# Patient Record
Sex: Female | Born: 1950
Health system: Southern US, Community
[De-identification: ages and names within clinical notes are randomized; demographics above are authoritative.]

## PROBLEM LIST (undated history)

## (undated) DIAGNOSIS — R42 Dizziness and giddiness: Secondary | ICD-10-CM

## (undated) DIAGNOSIS — F411 Generalized anxiety disorder: Secondary | ICD-10-CM

## (undated) DIAGNOSIS — I10 Essential (primary) hypertension: Secondary | ICD-10-CM

## (undated) DIAGNOSIS — N6459 Other signs and symptoms in breast: Secondary | ICD-10-CM

## (undated) DIAGNOSIS — E785 Hyperlipidemia, unspecified: Secondary | ICD-10-CM

## (undated) HISTORY — DX: Hyperlipidemia, unspecified: E78.5

## (undated) HISTORY — PX: APPENDECTOMY: SHX54

## (undated) HISTORY — PX: ABDOMINAL HYSTERECTOMY: SHX81

## (undated) HISTORY — PX: MENISCUS REPAIR: SHX5179

## (undated) HISTORY — DX: Generalized anxiety disorder: F41.1

---

## 2015-02-04 DIAGNOSIS — Z1389 Encounter for screening for other disorder: Secondary | ICD-10-CM | POA: Diagnosis not present

## 2015-02-04 DIAGNOSIS — Z0000 Encounter for general adult medical examination without abnormal findings: Secondary | ICD-10-CM | POA: Diagnosis not present

## 2015-02-04 DIAGNOSIS — Z13220 Encounter for screening for lipoid disorders: Secondary | ICD-10-CM | POA: Diagnosis not present

## 2015-02-04 DIAGNOSIS — I10 Essential (primary) hypertension: Secondary | ICD-10-CM | POA: Diagnosis not present

## 2015-04-22 DIAGNOSIS — H8101 Meniere's disease, right ear: Secondary | ICD-10-CM | POA: Diagnosis not present

## 2015-04-22 DIAGNOSIS — K130 Diseases of lips: Secondary | ICD-10-CM | POA: Diagnosis not present

## 2015-04-22 DIAGNOSIS — J320 Chronic maxillary sinusitis: Secondary | ICD-10-CM | POA: Diagnosis not present

## 2015-05-01 ENCOUNTER — Emergency Department (HOSPITAL_COMMUNITY)
Admission: EM | Admit: 2015-05-01 | Discharge: 2015-05-01 | Disposition: A | Discharge status: Home / Self Care | Payer: Medicare Other | Attending: Emergency Medicine | Admitting: Emergency Medicine

## 2015-05-01 ENCOUNTER — Emergency Department (HOSPITAL_COMMUNITY): Payer: Medicare Other

## 2015-05-01 ENCOUNTER — Encounter (HOSPITAL_COMMUNITY): Payer: Self-pay | Admitting: Emergency Medicine

## 2015-05-01 DIAGNOSIS — R51 Headache: Secondary | ICD-10-CM | POA: Diagnosis not present

## 2015-05-01 DIAGNOSIS — R5383 Other fatigue: Secondary | ICD-10-CM | POA: Insufficient documentation

## 2015-05-01 DIAGNOSIS — R002 Palpitations: Secondary | ICD-10-CM | POA: Diagnosis not present

## 2015-05-01 DIAGNOSIS — I10 Essential (primary) hypertension: Secondary | ICD-10-CM | POA: Diagnosis not present

## 2015-05-01 DIAGNOSIS — R109 Unspecified abdominal pain: Secondary | ICD-10-CM | POA: Insufficient documentation

## 2015-05-01 DIAGNOSIS — R000 Tachycardia, unspecified: Secondary | ICD-10-CM | POA: Diagnosis not present

## 2015-05-01 DIAGNOSIS — R42 Dizziness and giddiness: Secondary | ICD-10-CM | POA: Insufficient documentation

## 2015-05-01 DIAGNOSIS — R112 Nausea with vomiting, unspecified: Secondary | ICD-10-CM | POA: Insufficient documentation

## 2015-05-01 DIAGNOSIS — R079 Chest pain, unspecified: Secondary | ICD-10-CM | POA: Diagnosis not present

## 2015-05-01 DIAGNOSIS — R519 Headache, unspecified: Secondary | ICD-10-CM

## 2015-05-01 DIAGNOSIS — R251 Tremor, unspecified: Secondary | ICD-10-CM

## 2015-05-01 HISTORY — DX: Dizziness and giddiness: R42

## 2015-05-01 HISTORY — DX: Essential (primary) hypertension: I10

## 2015-05-01 LAB — URINALYSIS, ROUTINE W REFLEX MICROSCOPIC
Bilirubin Urine: NEGATIVE
Glucose, UA: NEGATIVE mg/dL
Hgb urine dipstick: NEGATIVE
Ketones, ur: NEGATIVE mg/dL
Leukocytes, UA: NEGATIVE
Nitrite: NEGATIVE
Protein, ur: NEGATIVE mg/dL
Specific Gravity, Urine: 1.018 (ref 1.005–1.030)
Urobilinogen, UA: 0.2 mg/dL (ref 0.0–1.0)
pH: 6.5 (ref 5.0–8.0)

## 2015-05-01 LAB — I-STAT TROPONIN, ED: TROPONIN I, POC: 0 ng/mL (ref 0.00–0.08)

## 2015-05-01 LAB — COMPREHENSIVE METABOLIC PANEL
ALT: 19 U/L (ref 14–54)
AST: 30 U/L (ref 15–41)
Albumin: 4.7 g/dL (ref 3.5–5.0)
Alkaline Phosphatase: 69 U/L (ref 38–126)
Anion gap: 11 (ref 5–15)
BUN: 15 mg/dL (ref 6–20)
CO2: 30 mmol/L (ref 22–32)
Calcium: 10.1 mg/dL (ref 8.9–10.3)
Chloride: 98 mmol/L — ABNORMAL LOW (ref 101–111)
Creatinine, Ser: 0.68 mg/dL (ref 0.44–1.00)
GFR calc Af Amer: >60 mL/min (ref >60)
GFR calc non Af Amer: >60 mL/min (ref >60)
Glucose, Bld: 117 mg/dL — ABNORMAL HIGH (ref 65–99)
Potassium: 3.3 mmol/L — ABNORMAL LOW (ref 3.5–5.1)
Sodium: 139 mmol/L (ref 135–145)
Total Bilirubin: 0.6 mg/dL (ref 0.3–1.2)
Total Protein: 8.5 g/dL — ABNORMAL HIGH (ref 6.5–8.1)

## 2015-05-01 LAB — CBC
HCT: 41.7 % (ref 36.0–46.0)
Hemoglobin: 14.4 g/dL (ref 12.0–15.0)
MCH: 31.6 pg (ref 26.0–34.0)
MCHC: 34.5 g/dL (ref 30.0–36.0)
MCV: 91.6 fL (ref 78.0–100.0)
Platelets: 401 10*3/uL — ABNORMAL HIGH (ref 150–400)
RBC: 4.55 MIL/uL (ref 3.87–5.11)
RDW: 13.1 % (ref 11.5–15.5)
WBC: 7.8 10*3/uL (ref 4.0–10.5)

## 2015-05-01 LAB — CBG MONITORING, ED
GLUCOSE-CAPILLARY: 100 mg/dL — AB (ref 65–99)
Glucose-Capillary: 110 mg/dL — ABNORMAL HIGH (ref 65–99)

## 2015-05-01 LAB — LIPASE, BLOOD: Lipase: 33 U/L (ref 22–51)

## 2015-05-01 MED ORDER — ONDANSETRON 4 MG PO TBDP: 4.0000 mg | ORAL_TABLET | Freq: Three times a day (TID) | ORAL | Status: DC | PRN

## 2015-05-01 MED ORDER — MORPHINE SULFATE (PF) 4 MG/ML IV SOLN
4.0000 mg | Freq: Once | INTRAVENOUS | Status: AC
  Administered 2015-05-01: 4 mg via INTRAVENOUS
  Filled 2015-05-01: qty 1

## 2015-05-01 MED ORDER — ACETAMINOPHEN 325 MG PO TABS
650.0000 mg | ORAL_TABLET | Freq: Once | ORAL | Status: AC
  Administered 2015-05-01: 650 mg via ORAL
  Filled 2015-05-01: qty 2

## 2015-05-01 MED ORDER — LORAZEPAM 1 MG PO TABS: 0.5000 mg | ORAL_TABLET | ORAL | Status: DC | PRN

## 2015-05-01 MED ORDER — ONDANSETRON HCL 4 MG/2ML IJ SOLN
4.0000 mg | Freq: Once | INTRAMUSCULAR | Status: AC
  Administered 2015-05-01: 4 mg via INTRAVENOUS
  Filled 2015-05-01: qty 2

## 2015-05-01 MED ORDER — POTASSIUM CHLORIDE CRYS ER 20 MEQ PO TBCR
40.0000 meq | EXTENDED_RELEASE_TABLET | Freq: Once | ORAL | Status: AC
  Administered 2015-05-01: 40 meq via ORAL
  Filled 2015-05-01: qty 2

## 2015-05-01 MED ORDER — SODIUM CHLORIDE 0.9 % IV BOLUS (SEPSIS)
1000.0000 mL | Freq: Once | INTRAVENOUS | Status: AC
Start: 2015-05-01 — End: 2015-05-01
  Administered 2015-05-01: 1000 mL via INTRAVENOUS

## 2015-05-01 MED ORDER — LORAZEPAM 2 MG/ML IJ SOLN
0.5000 mg | Freq: Once | INTRAMUSCULAR | Status: AC
  Administered 2015-05-01: 0.5 mg via INTRAVENOUS
  Filled 2015-05-01: qty 1

## 2015-05-01 MED ORDER — HYDROCODONE-ACETAMINOPHEN 5-325 MG PO TABS: 1.0000 | ORAL_TABLET | ORAL | Status: DC | PRN

## 2015-05-01 NOTE — Discharge Instructions (Signed)
1. Medications: vicodin, zofran, ativan usual home medications 2. Treatment: rest, drink plenty of fluids 3. Follow Up: please followup with your primary doctor within the next week for discussion of your diagnoses and further evaluation after today's visit; if you do not have a primary care doctor use the resource guide provided to find one; please return to the ER for persistent symptoms, high fever, worsening headache, intractable vomiting, falls, loss of consciousness   General Headache Without Cause A general headache is pain or discomfort felt around the head or neck area. The cause may not be found.  HOME CARE   Keep all doctor visits.  Only take medicines as told by your doctor.  Lie down in a dark, quiet room when you have a headache.  Keep a journal to find out if certain things bring on headaches. For example, write down:  What you eat and drink.  How much sleep you get.  Any change to your diet or medicines.  Relax by getting a massage or doing other relaxing activities.  Put ice or heat packs on the head and neck area as told by your doctor.  Lessen stress.  Sit up straight. Do not tighten (tense) your muscles.  Quit smoking if you smoke.  Lessen how much alcohol you drink.  Lessen how much caffeine you drink, or stop drinking caffeine.  Eat and sleep on a regular schedule.  Get 7 to 9 hours of sleep, or as told by your doctor.  Keep lights dim if bright lights bother you or make your headaches worse. GET HELP RIGHT AWAY IF:   Your headache becomes really bad.  You have a fever.  You have a stiff neck.  You have trouble seeing.  Your muscles are weak, or you lose muscle control.  You lose your balance or have trouble walking.  You feel like you will pass out (faint), or you pass out.  You have really bad symptoms that are different than your first symptoms.  You have problems with the medicines given to you by your doctor.  Your medicines do  not work.  Your headache feels different than the other headaches.  You feel sick to your stomach (nauseous) or throw up (vomit). MAKE SURE YOU:   Understand these instructions.  Will watch your condition.  Will get help right away if you are not doing well or get worse. Document Released: 04/27/2008 Document Revised: 10/11/2011 Document Reviewed: 07/09/2011 [facility] Patient Information 2015 [place], [place]. This information is not intended to replace advice given to you by your health care provider. Make sure you discuss any questions you have with your health care provider.  Nausea and Vomiting Nausea is a sick feeling that often comes before throwing up (vomiting). Vomiting is a reflex where stomach contents come out of your mouth. Vomiting can cause severe loss of body fluids (dehydration). Children and elderly adults can become dehydrated quickly, especially if they also have diarrhea. Nausea and vomiting are symptoms of a condition or disease. It is important to find the cause of your symptoms. CAUSES   Direct irritation of the stomach lining. This irritation can result from increased acid production (gastroesophageal reflux disease), infection, food poisoning, taking certain medicines (such as nonsteroidal anti-inflammatory drugs), alcohol use, or tobacco use.  Signals from the brain.These signals could be caused by a headache, heat exposure, an inner ear disturbance, increased pressure in the brain from injury, infection, a tumor, or a concussion, pain, emotional stimulus, or metabolic problems.  An obstruction in the gastrointestinal tract (bowel obstruction).  Illnesses such as diabetes, hepatitis, gallbladder problems, appendicitis, kidney problems, cancer, sepsis, atypical symptoms of a heart attack, or eating disorders.  Medical treatments such as chemotherapy and radiation.  Receiving medicine that makes you sleep (general anesthetic) during surgery. DIAGNOSIS Your  caregiver may ask for tests to be done if the problems do not improve after a few days. Tests may also be done if symptoms are severe or if the reason for the nausea and vomiting is not clear. Tests may include:  Urine tests.  Blood tests.  Stool tests.  Cultures (to look for evidence of infection).  X-rays or other imaging studies. Test results can help your caregiver make decisions about treatment or the need for additional tests. TREATMENT You need to stay well hydrated. Drink frequently but in small amounts.You may wish to drink water, sports drinks, clear broth, or eat frozen ice pops or gelatin dessert to help stay hydrated.When you eat, eating slowly may help prevent nausea.There are also some antinausea medicines that may help prevent nausea. HOME CARE INSTRUCTIONS   Take all medicine as directed by your caregiver.  If you do not have an appetite, do not force yourself to eat. However, you must continue to drink fluids.  If you have an appetite, eat a normal diet unless your caregiver tells you differently.  Eat a variety of complex carbohydrates (rice, wheat, potatoes, bread), lean meats, yogurt, fruits, and vegetables.  Avoid high-fat foods because they are more difficult to digest.  Drink enough water and fluids to keep your urine clear or pale yellow.  If you are dehydrated, ask your caregiver for specific rehydration instructions. Signs of dehydration may include:  Severe thirst.  Dry lips and mouth.  Dizziness.  Dark urine.  Decreasing urine frequency and amount.  Confusion.  Rapid breathing or pulse. SEEK IMMEDIATE MEDICAL CARE IF:   You have blood or brown flecks (like coffee grounds) in your vomit.  You have black or bloody stools.  You have a severe headache or stiff neck.  You are confused.  You have severe abdominal pain.  You have chest pain or trouble breathing.  You do not urinate at least once every 8 hours.  You develop cold or  clammy skin.  You continue to vomit for longer than 24 to 48 hours.  You have a fever. MAKE SURE YOU:   Understand these instructions.  Will watch your condition.  Will get help right away if you are not doing well or get worse. Document Released: 07/19/2005 Document Revised: 10/11/2011 Document Reviewed: 12/16/2010 [facility] Patient Information 2015 [place], [place]. This information is not intended to replace advice given to you by your health care provider. Make sure you discuss any questions you have with your health care provider.  Tremor Tremor is a rhythmic, involuntary muscular contraction characterized by oscillations (to-and-fro movements) of a part of the body. The most common of all involuntary movements, tremor can affect various body parts such as the hands, head, facial structures, vocal cords, trunk, and legs; most tremors, however, occur in the hands. Tremor often accompanies neurological disorders associated with aging. Although the disorder is not life-threatening, it can be responsible for functional disability and social embarrassment. TREATMENT  There are many types of tremor and several ways in which tremor is classified. The most common classification is by behavioral context or position. There are five categories of tremor within this classification: resting, postural, kinetic, task-specific, and psychogenic. Resting or  static tremor occurs when the muscle is at rest, for example when the hands are lying on the lap. This type of tremor is often seen in patients with Parkinson's disease. Postural tremor occurs when a patient attempts to maintain posture, such as holding the hands outstretched. Postural tremors include physiological tremor, essential tremor, tremor with basal ganglia disease (also seen in patients with Parkinson's disease), cerebellar postural tremor, tremor with peripheral neuropathy, post-traumatic tremor, and alcoholic tremor. Kinetic or intention (action)  tremor occurs during purposeful movement, for example during finger-to-nose testing. Task-specific tremor appears when performing goal-oriented tasks such as handwriting, speaking, or standing. This group consists of primary writing tremor, vocal tremor, and orthostatic tremor. Psychogenic tremor occurs in both older and younger patients. The key feature of this tremor is that it dramatically lessens or disappears when the patient is distracted. PROGNOSIS There are some treatment options available for tremor; the appropriate treatment depends on accurate diagnosis of the cause. Some tremors respond to treatment of the underlying condition, for example in some cases of psychogenic tremor treating the patient's underlying mental problem may cause the tremor to disappear. Also, patients with tremor due to Parkinson's disease may be treated with Levodopa drug therapy. Symptomatic drug therapy is available for several other tremors as well. For those cases of tremor in which there is no effective drug treatment, physical measures such as teaching the patient to brace the affected limb during the tremor are sometimes useful. Surgical intervention such as thalamotomy or deep brain stimulation may be useful in certain cases. Document Released: 07/09/2002 Document Revised: 10/11/2011 Document Reviewed: 07/19/2005 [facility] Patient Information 2015 [place], [place]. This information is not intended to replace advice given to you by your health care provider. Make sure you discuss any questions you have with your health care provider.   Emergency Department Resource Guide 1) Find a Doctor and Pay Out of Pocket Although you won't have to find out who is covered by your insurance plan, it is a good idea to ask around and get recommendations. You will then need to call the office and see if the doctor you have chosen will accept you as a new patient and what types of options they offer for patients who are self-pay. Some  doctors offer discounts or will set up payment plans for their patients who do not have insurance, but you will need to ask so you aren't surprised when you get to your appointment.  2) Contact Your Local Health Department Not all health departments have doctors that can see patients for sick visits, but many do, so it is worth a call to see if yours does. If you don't know where your local health department is, you can check in your phone book. The CDC also has a tool to help you locate your state's health department, and many state websites also have listings of all of their local health departments.  3) Find a Walk-in Clinic If your illness is not likely to be very severe or complicated, you may want to try a walk in clinic. These are popping up all over the country in pharmacies, drugstores, and shopping centers. They're usually staffed by nurse practitioners or physician assistants that have been trained to treat common illnesses and complaints. They're usually fairly quick and inexpensive. However, if you have serious medical issues or chronic medical problems, these are probably not your best option.  No Primary Care Doctor: - Call Health Connect at  [phone] - they can help you locate  a primary care doctor that  accepts your insurance, provides certain services, etc. - Physician Referral Service- [phone]  Chronic Pain Problems: Organization         Address  Phone   Notes  [facility]  [phone] Patients need to be referred by their primary care doctor.   Medication Assistance: Organization         Address  Phone   Notes  [facility] Medication [facility] [address] [place] [place], [place] [address] [phone] --Must be a resident of [facility] -- Must have NO insurance coverage whatsoever (no Medicaid/ Medicare, etc.) -- The pt. MUST have a primary care doctor that directs their care regularly and follows them in the  community   [facility]  [phone]   [employer]  [phone]    Agencies that provide inexpensive medical care: Organization         Address  Phone   Notes  [facility]  [phone]   [facility]    [phone]   [facility] [address] [place], [place] [address] [phone]   [facility] [address] [place] [place] [phone]   Planned Parenthood    [phone]   [facility]    [phone]   Community Health and [facility]  [address], [place] Phone:  [phone], Fax:  [phone] Hours of Operation:  9 am - 6 pm, M-F.  Also accepts Medicaid/Medicare and self-pay.  [facility]  [address], [place] Phone: [phone], Fax: [phone]. Hours of Operation:  8:30 am - 5:30 pm, M-F.  Also accepts Medicaid and self-pay.  [facility] [place] [address] [place] Phone: [phone]   [facility] [address] Natasha Bence [place], [place] [phone], Ext. [phone] Mondays & Thursdays: 7-9 AM.  First 15 patients are seen on a first come, first serve basis.    Medicaid-accepting [facility] Providers:  Organization         Address  Phone   Notes  [facility] [address], [place] [phone] Also accepts self-pay patients.  [facility] [address] Laurell Josephs [place], [place]  [phone]   [facility] [address], [place] [phone]   [facility] Family Medicine [address] [place] [phone]   Renaye Rakers [address], [place]   [phone] Only accepts [place] Access [place] patients after they have their name applied to their card.   Self-Pay (no insurance) in [facility]:  Organization         Address  Phone   Notes  Sickle Cell Patients, [facility]  Internal Medicine [address] [place], [place] [phone]   [facility] [address] [place], [place] [phone]   [facility]  [address] [place] [address], [place] [phone]   [facility]/Dr. Osei-Bonsu  [address], [place] or [address], [place] [phone] Phone number for both [place] and [place] locations is the same.  Urgent Medical and [facility] [address], [place] [phone]   [facility] [address] [place] or [address] [phone] (  [phone]   [facility] [address], [place] [phone], phone; [phone], fax Sees patients 1st and 3rd Saturday of every month.  Must not qualify for public or private insurance (i.e. Medicaid, Medicare, [place] Health Choice, Veterans' Benefits)  Household income should be no more than 200% of the poverty level The clinic cannot treat you if you are pregnant or think you are pregnant  Sexually transmitted diseases are not treated at the clinic.    Dental Care: Organization         Address  Phone  Notes  [facility] Department of [facility] [address] [place], [place] [phone] Accepts children up to age 21 who are enrolled in [place] or [place] Health Choice; pregnant women with a Medicaid card; and children who have applied for Medicaid or [place] Health Choice, but were declined, whose parents can pay a reduced fee at time of service.  [facility] Department of [facility]  [address], [place] [phone] Accepts children up to age 67 who are enrolled in [place] or [place] Health Choice; pregnant women with a Medicaid card; and children who have applied for Medicaid or [place] Health Choice, but were declined, whose parents can pay a reduced fee at time of service.  [facility]  [address] [place], [place] [phone] Patients are seen by appointment only. Walk-ins are not accepted. [facility] will see patients 72 years of age and older. Monday - Tuesday (8am-5pm) Most Wednesdays (8:30-5pm) $30 per visit, cash only  [facility] Adult Dental Access PRO[facility]  [address], [facility] [phone] Patients are seen by appointment only. Walk-ins are not accepted. [facility] will see patients 38 years of age and older. One Wednesday Evening (Monthly: Volunteer Based).  $30 per visit, cash only  [employer]  [phone] for adults; Children under age 46, call Graduate Pediatric Dentistry at [phone]. Children aged 58-14, please call [phone] to request a pediatric application.  Dental services are provided in all areas of dental care including fillings, crowns and bridges, complete and partial dentures, implants, gum treatment, root canals, and extractions. Preventive care is also provided. Treatment is provided to both adults and children. Patients are selected via a lottery and there is often a waiting list.   [facility] [address] [place]  [phone] [email or web address]   [facility] [address] [place], [place] [phone], Ext. [phone] Second and Fourth Thursday of each month, opens at 6:30 AM; Clinic ends at 9 AM.  Patients are seen on a first-come first-served basis, and a limited number are seen during each clinic.   [facility]  [address] Ether Griffins [place], [place] [phone]   Eligibility Requirements You must have lived in [place], [place], or [place] counties for at least the last three months.   You cannot be eligible for state or federal sponsored [place], including [employer], [place], or [employer].   You generally cannot be eligible for healthcare insurance through your employer.    How to apply: Eligibility screenings are held every  Tuesday and Wednesday afternoon from 1:00 pm until 4:00 pm. You do not need an appointment for the interview!  [facility] [address], [place], [place] [phone]   [facility]  [phone]   [facility] Health Department  [phone]  [facility] Department  [phone]    Behavioral Health Resources in the Community: Intensive Outpatient Programs Organization         Address  Phone  Notes  [facility] Services [address] [place], [place] [phone]   [facility] Outpatient [address] [place], [place] [phone]   [facility] [address], [place], [place]  [phone]   [facility] Mental Health [address],  [place], [place] [phone] or [phone]   Substance Abuse Resources Organization         Address  Phone  Notes  [facility]  [phone]   [facility]  [phone]   [facility] [place]  [phone]   Floydene Flock  [phone]   Residential & Outpatient Substance Abuse Program  [phone]   Psychological Services Organization         Address  Phone  Notes  [facility]  [phone]   [facility]  [phone]   [facility] Mental Health [address], [place] [phone] or [phone]    Mobile Crisis Teams Organization         Address  Phone  Notes  [facility], Mobile Crisis Care Unit  [phone]   [facility]  [address] [place], [place] [phone]   Doristine Locks [address] [place] [phone]    Self-Help/Support Groups Organization         Address  Phone             Notes  [facility] [place] - variety of support groups  336- I7437963 Call for more information  Narcotics Anonymous (NA), [facility] [address], [employer] [place]  2 meetings at this location    Statistician         Address  Phone  Notes  [facility] [address],    [place]  [phone]   [facility]  [address] [place] [address], [place], [place] [phone]   [facility] [address] [place], [place] [phone] Admissions: 8am-3pm M-F  [facility] [address]    [place], [place] [phone]   [facility] [address] [place], [place], [place] [phone]   [facility] [address]  [place], [place] [phone]   [facility] - Intensive Outpatient [address] Laurell Joseph[address], [place], [place] [phone]   [facility] ([facility]iction [facility] [address] [place].,  [place], [place] [phone] or [phone]   [facility] ([facility]) [address] [place], [place] [phone] Accepts Medicaid  [facility] [address],  [place] [phone] Substance Abuse/Addiction Treatment   [facility] Organization         Address  Phone  Notes  [facility]  [phone]   Angie Fava, PhD [address] Ervin Knack [place], [place]   [phone] or [phone]   [facility]   [address] [place], [place] [phone]   [facility] [address], [place], [place] [phone] Insurance/Medicaid/sponsorship through [employer] and Families [address]                                    [place], [place] [phone] Therapy/tele-psych/case  [facility] [address]   [place], [place] [phone]    Dr. Lolly Mustache  [phone]   [facility]  [employer] [facility] Dept. 1) [address], [place] 2) [address] [place] 3)  [address], [place] [phone]  [phone]   [facility] Child Abuse Hotline [phone] or [phone] (After  Hours)

## 2015-05-01 NOTE — ED Notes (Signed)
Pt escorted to discharge window. Pt verbalized understanding discharge instructions. In no acute distress.  

## 2015-05-01 NOTE — ED Provider Notes (Signed)
CSN: [id number]     Arrival date & time 05/01/15  1356 History   First MD Initiated Contact with Patient 05/01/15 1457     Chief Complaint  Patient presents with  . Tachycardia  . Tremors  . Emesis  . Headache    HPI   Paige Calhoun is a 64 y.o. female with a PMH of HTN, vertigo who presents to the ED with headache, tremors, and vomiting. She states she felt fine last night, but started to develop a headache to the right side of her head around 8:30 PM. She states she went to bed, and woke up with body tremors to her upper and lower extremities bilaterally. She also reports she vomited 7-8 times last night. She denies hematemesis. She reports her symptoms have been constant, and is unable to identify anything that precipitates or relieves her symptoms. She states she has a history of vertigo, and that she felt dizzy yesterday, however she currently denies dizziness, lightheadedness, syncope, numbness, paresthesia, weakness. She denies recent illness or injury. She denies fever, chills, cough, congestion. She reports she experienced chest tightness intermittently yesterday. She states she sometimes has shortness of breath when she walks for long periods of time, but denies new shortness of breath. She reports abdominal pain with vomiting. She denies diarrhea, constipation, dysuria, urgency, frequency.   Past Medical History  Diagnosis Date  . Hypertension   . Vertigo    Past Surgical History  Procedure Laterality Date  . Appendectomy    . Abdominal hysterectomy    . Meniscus repair Left    History reviewed. No pertinent family history. Social History  Substance Use Topics  . Smoking status: None  . Smokeless tobacco: None  . Alcohol Use: None   OB History    No data available      Review of Systems  Constitutional: Positive for fatigue. Negative for fever, chills, activity change and appetite change.  HENT: Negative for congestion.   Eyes: Negative for visual disturbance.   Respiratory: Negative for cough and shortness of breath.   Cardiovascular: Positive for chest pain and palpitations.  Gastrointestinal: Positive for nausea, vomiting and abdominal pain. Negative for diarrhea, constipation and abdominal distention.  Genitourinary: Negative for dysuria, urgency and frequency.  Musculoskeletal: Negative for myalgias, back pain, arthralgias, neck pain and neck stiffness.  Skin: Negative for color change, pallor, rash and wound.  Neurological: Positive for dizziness, tremors and headaches. Negative for syncope, facial asymmetry, weakness, light-headedness and numbness.  Psychiatric/Behavioral: Negative for confusion.  All other systems reviewed and are negative.     Allergies  Review of patient's allergies indicates no known allergies.  Home Medications   Prior to Admission medications   Not on File    BP 166/104 mmHg  Pulse 130  Temp(Src) 98.1 F (36.7 C) (Oral)  Resp 22  SpO2 100% Physical Exam  Constitutional: She is oriented to person, place, and time. She appears well-developed and well-nourished. No distress.  HENT:  Head: Normocephalic and atraumatic.  Right Ear: External ear normal.  Left Ear: External ear normal.  Nose: Nose normal.  Mouth/Throat: Uvula is midline, oropharynx is clear and moist and mucous membranes are normal.  Eyes: Conjunctivae, EOM and lids are normal. Pupils are equal, round, and reactive to light. Right eye exhibits no discharge. Left eye exhibits no discharge. No scleral icterus.  Neck: Normal range of motion. Neck supple.  Cardiovascular: Regular rhythm, normal heart sounds, intact distal pulses and normal pulses.  Tachycardia present.  Pulmonary/Chest: Effort normal and breath sounds normal. No respiratory distress. She has no wheezes. She has no rales. She exhibits no tenderness.  Abdominal: Soft. Normal appearance and bowel sounds are normal. She exhibits no distension and no mass. There is no tenderness.  There is no rigidity, no rebound and no guarding.  Musculoskeletal: Normal range of motion. She exhibits no edema or tenderness.  Neurological: She is alert and oriented to person, place, and time. She has normal strength and normal reflexes. She displays tremor. No cranial nerve deficit. She exhibits abnormal muscle tone. Coordination normal.  Patient with tremor to her upper and lower extremities bilaterally. Increased tone with range of motion. No clonus. No facial asymmetry. Speech fluent. No visual field deficit. Sensation to light touch intact. Mild truncal ataxia. Coordination with finger to nose intact.  Skin: Skin is warm, dry and intact. No rash noted. She is not diaphoretic. No erythema. No pallor.  Psychiatric: She has a normal mood and affect. Her speech is normal and behavior is normal. Judgment and thought content normal.  Nursing note and vitals reviewed.   ED Course  Procedures (including critical care time)  Labs Review Labs Reviewed  COMPREHENSIVE METABOLIC PANEL - Abnormal; Notable for the following:    Potassium 3.3 (*)    Chloride 98 (*)    Glucose, Bld 117 (*)    Total Protein 8.5 (*)    All other components within normal limits  CBC - Abnormal; Notable for the following:    Platelets 401 (*)    All other components within normal limits  URINALYSIS, ROUTINE W REFLEX MICROSCOPIC (NOT AT [facility]) - Abnormal; Notable for the following:    APPearance CLOUDY (*)    All other components within normal limits  CBG MONITORING, ED - Abnormal; Notable for the following:    Glucose-Capillary 100 (*)    All other components within normal limits  CBG MONITORING, ED - Abnormal; Notable for the following:    Glucose-Capillary 110 (*)    All other components within normal limits  LIPASE, BLOOD  I-STAT TROPOININ, ED    Imaging Review Ct Head Wo Contrast  05/01/2015   CLINICAL DATA:  Headache with nausea and tremors for 1 day ; vomiting  EXAM: CT HEAD WITHOUT CONTRAST   TECHNIQUE: Contiguous axial images were obtained from the base of the skull through the vertex without intravenous contrast.  COMPARISON:  None.  FINDINGS: There is age related volume loss. There is no intracranial mass, hemorrhage, extra-axial fluid collection, or midline shift. Gray-white compartments are normal. No acute infarct evident. Bony calvarium appears intact. Mastoids on the left are clear. Most of the mastoids on the right are opacified.  IMPRESSION: Chronic appearing mastoid disease on the right. Mastoids on the left are clear. There is no intracranial mass, hemorrhage, or focal gray - white compartment lesions/acute appearing infarct.   Electronically Signed   By: Bretta Bang III M.D.   On: 05/01/2015 15:45     I have personally reviewed and evaluated these images and lab results as part of my medical decision-making.   EKG Interpretation   Date/Time:  Thursday May 01 2015 14:28:34 EDT Ventricular Rate:  118 PR Interval:  143 QRS Duration: 95 QT Interval:  403 QTC Calculation: 565 R Axis:   89 Text Interpretation:  Sinus tachycardia Right atrial enlargement  Borderline right axis deviation Abnormal T, consider ischemia, diffuse  leads No old tracing to compare Confirmed by Juleen China  MD, STEPHEN [phone]) on  05/01/2015  3:02:50 PM      MDM   Final diagnoses:  Headache  Tremor  Non-intractable vomiting with nausea, vomiting of unspecified type   64 year old female presents with headache, tremors, nausea and vomiting. She states her symptoms started last night, and been constant since that time. Denies dizziness, lightheadedness, syncope, numbness, paresthesia, weakness. Denies recent illness or injury. Denies fever, chills, cough, congestion. Reports abdominal pain with vomiting. Denies diarrhea, constipation, dysuria, urgency, frequency.  Patient is afebrile. Tachycardic to 130s. Heart regular rhythm. Lungs clear to auscultation bilaterally. Abdomen soft, nontender,  nondistended. Diffuse tremor to upper and lower extremities bilaterally, with improvement in tremor with complex tasks. Increased tone with range of motion of upper and lower extremities bilaterally. No clonus. CNs III-XII grossly intact. No visual field deficit. No facial asymmetry. Speech fluent. Sensation to light touch intact. Strength 5/5 in all 4 extremities. Mild truncal ataxia with sitting and standing, though patient can do this independently. Coordination with finger to nose intact.  Nausea and pain controlled in the ED. Patient given IV fluids and ativan. CBC negative for leukocytosis or anemia. CMP with potassium 3.3, repleted in the ED. EKG with no acute ischemia. Troponin negative x 1. Lipase within normal limits. UA no evidence of infection. Head CT with chronic appearing mastoid disease on the right, no intracranial mass, hemorrhage, or focal lesions/acute appearing infarct.  Neurology consulted. Spoke with Dr. Roseanne Reno, who advised with no focal deficit and normal head CT, feel patient does not need further evaluation.   Significant improvement in blood pressure and heart rate s/p fluid administration. Patient reports symptom resolution in the ED. She states she no longer has a headache, and has no tremors. Repeat neuro exam normal with no focal deficit. CNs III-XII grossly intact. Strength and sensation intact. Coordination intact. Patient ambulates without difficulty or ataxia. Alert and oriented x 4.  Feel patient is stable for discharge at this time with close PCP follow-up. Will give zofran, pain medicine, and ativan for symptoms. Return precautions discussed at length with patient. Patient verbalizes understanding and agrees with plan.   BP 119/68 mmHg  Pulse 76  Temp(Src) 98.1 F (36.7 C) (Oral)  Resp 13  SpO2 98%    Mady Gemma, PA-C 05/02/15 0205  Raeford Razor, MD 05/10/15 0127

## 2015-05-01 NOTE — ED Notes (Signed)
Pt back from CT

## 2015-05-01 NOTE — ED Notes (Signed)
RN Nease notified of vitals

## 2015-05-01 NOTE — ED Notes (Signed)
Nurse getting Istat traponin

## 2015-05-01 NOTE — ED Notes (Signed)
Pt ambulated without difficulty

## 2015-05-01 NOTE — ED Notes (Signed)
Pt reports headache started 2030, hx of vertigo and right ear issues, pt sees ENT about vertigo issues. Full body tremors 2230 after pt went to bed. Pt started vomiting last night, vomited 8x last night, this morning 3x. Headache has been constant. Headache was 7/10 upon arrival, now 9/10.   Pt may have visual field defict in right upper area, pt sensory was not correct in both lower legs, pt was getting sharp and soft sensations incorrect. Tremors more noticeable in legs than hands. Equal hand grips. Legs dropped but did not touch bed, during leg strength test.

## 2015-05-01 NOTE — ED Provider Notes (Signed)
Medical screening examination/treatment/procedure(s) were performed by non-physician practitioner and as supervising physician I was immediately available for consultation/collaboration.  64yF with tremor, HA and upper back pain. Pronounced resting tremor. Whole body but most noticeable in LE. Seems to improve with movement. CN 2-12 intact. Hard to tell with head shaking, but doesn't seem to have nystagmus. Strength is 5/5 all extremities. Tone in LE seems increased with some cogwheeling. Biceps and patellar reflexes seem normal. Good finger to nose b/l. Able to sit up in bed unassisted. Unable to stand unassisted with feet together with either eyes open or closed. Did not try to ambulate.   Seems most consistent with essential tremor. Improve with movement and more complex tasks.  Brought on by physiologic stress with recent n/v? Doesn't report significant tremor at baseline though. Does have some truncal ataxia. Cerebellar issue? Bleed with headache and n/v?  Hx of vertigo but constellation of symptoms not consistent with simply this. Can't necessarily correlate tachyardia. May be somewhat dry with several episode of vomiting. IVF. Treat nausea and pain. Low dose benzo. Will CT head.   Imaging and other w/u fairly unremarkable. Symptoms now markedly improved. Can ambulate unassisted.  HR in 70s. Essential tremor? Anxiety? Does not appear to be serious acute neurological event or other serious condition. I feel stable for DC at this time.    EKG Interpretation   Date/Time:  Thursday May 01 2015 14:28:34 EDT Ventricular Rate:  118 PR Interval:  143 QRS Duration: 95 QT Interval:  403 QTC Calculation: 565 R Axis:   89 Text Interpretation:  Sinus tachycardia Right atrial enlargement  Borderline right axis deviation Abnormal T, consider ischemia, diffuse  leads No old tracing to compare Confirmed by Juleen China  MD, STEPHEN [phone]) on  05/01/2015 3:02:50 PM       Raeford Razor, MD 05/02/15  [phone]

## 2015-05-01 NOTE — ED Notes (Signed)
Pt states last night began having mild tremors, nausea and a headache around 2100. States she was up most of the night vomiting, the tremors worsening, and now has a terrible right sided head pain continuing into right neck and back. Pt states there were some black particles in vomit, has black residue on posterior tongue.   In triage CBG 100, tachycardic at 130, BP elevated at 166/104, moderate body tremors visible.

## 2015-05-06 DIAGNOSIS — I10 Essential (primary) hypertension: Secondary | ICD-10-CM | POA: Diagnosis not present

## 2015-05-06 DIAGNOSIS — H8110 Benign paroxysmal vertigo, unspecified ear: Secondary | ICD-10-CM | POA: Diagnosis not present

## 2015-05-14 DIAGNOSIS — I10 Essential (primary) hypertension: Secondary | ICD-10-CM | POA: Diagnosis not present

## 2016-03-10 DIAGNOSIS — F411 Generalized anxiety disorder: Secondary | ICD-10-CM | POA: Diagnosis not present

## 2016-03-10 DIAGNOSIS — H8101 Meniere's disease, right ear: Secondary | ICD-10-CM | POA: Diagnosis not present

## 2016-03-10 DIAGNOSIS — H6590 Unspecified nonsuppurative otitis media, unspecified ear: Secondary | ICD-10-CM | POA: Diagnosis not present

## 2016-04-14 DIAGNOSIS — H8101 Meniere's disease, right ear: Secondary | ICD-10-CM | POA: Diagnosis not present

## 2016-04-14 DIAGNOSIS — F411 Generalized anxiety disorder: Secondary | ICD-10-CM | POA: Diagnosis not present

## 2016-04-14 DIAGNOSIS — I10 Essential (primary) hypertension: Secondary | ICD-10-CM | POA: Diagnosis not present

## 2016-04-14 DIAGNOSIS — Z23 Encounter for immunization: Secondary | ICD-10-CM | POA: Diagnosis not present

## 2016-04-14 DIAGNOSIS — Z0000 Encounter for general adult medical examination without abnormal findings: Secondary | ICD-10-CM | POA: Diagnosis not present

## 2016-05-31 ENCOUNTER — Other Ambulatory Visit: Payer: Self-pay | Admitting: Family Medicine

## 2016-05-31 DIAGNOSIS — Z1231 Encounter for screening mammogram for malignant neoplasm of breast: Secondary | ICD-10-CM

## 2016-06-17 ENCOUNTER — Ambulatory Visit
Admission: RE | Admit: 2016-06-17 | Discharge: 2016-06-17 | Disposition: A | Discharge status: Home / Self Care | Payer: Medicare Other | Source: Ambulatory Visit | Attending: Family Medicine | Admitting: Family Medicine

## 2016-06-17 DIAGNOSIS — Z1231 Encounter for screening mammogram for malignant neoplasm of breast: Secondary | ICD-10-CM

## 2016-10-18 DIAGNOSIS — F411 Generalized anxiety disorder: Secondary | ICD-10-CM | POA: Diagnosis not present

## 2016-10-18 DIAGNOSIS — H8101 Meniere's disease, right ear: Secondary | ICD-10-CM | POA: Diagnosis not present

## 2017-01-18 DIAGNOSIS — M545 Low back pain: Secondary | ICD-10-CM | POA: Diagnosis not present

## 2017-01-18 DIAGNOSIS — R1310 Dysphagia, unspecified: Secondary | ICD-10-CM | POA: Diagnosis not present

## 2017-04-20 DIAGNOSIS — F411 Generalized anxiety disorder: Secondary | ICD-10-CM | POA: Diagnosis not present

## 2017-04-20 DIAGNOSIS — Z23 Encounter for immunization: Secondary | ICD-10-CM | POA: Diagnosis not present

## 2017-04-20 DIAGNOSIS — H8101 Meniere's disease, right ear: Secondary | ICD-10-CM | POA: Diagnosis not present

## 2017-04-20 DIAGNOSIS — M8588 Other specified disorders of bone density and structure, other site: Secondary | ICD-10-CM | POA: Diagnosis not present

## 2017-04-20 DIAGNOSIS — G479 Sleep disorder, unspecified: Secondary | ICD-10-CM | POA: Diagnosis not present

## 2017-04-20 DIAGNOSIS — I10 Essential (primary) hypertension: Secondary | ICD-10-CM | POA: Diagnosis not present

## 2017-04-20 DIAGNOSIS — M545 Low back pain: Secondary | ICD-10-CM | POA: Diagnosis not present

## 2017-04-20 DIAGNOSIS — Z0000 Encounter for general adult medical examination without abnormal findings: Secondary | ICD-10-CM | POA: Diagnosis not present

## 2017-04-29 ENCOUNTER — Other Ambulatory Visit: Payer: Self-pay | Admitting: Family Medicine

## 2017-04-29 DIAGNOSIS — R5381 Other malaise: Secondary | ICD-10-CM

## 2017-05-18 ENCOUNTER — Other Ambulatory Visit: Payer: Self-pay | Admitting: Family Medicine

## 2017-05-18 DIAGNOSIS — Z1231 Encounter for screening mammogram for malignant neoplasm of breast: Secondary | ICD-10-CM

## 2017-05-18 DIAGNOSIS — M8580 Other specified disorders of bone density and structure, unspecified site: Secondary | ICD-10-CM

## 2017-05-26 DIAGNOSIS — N6320 Unspecified lump in the left breast, unspecified quadrant: Secondary | ICD-10-CM | POA: Diagnosis not present

## 2017-06-01 ENCOUNTER — Other Ambulatory Visit: Payer: Self-pay | Admitting: Family Medicine

## 2017-06-01 DIAGNOSIS — N63 Unspecified lump in unspecified breast: Secondary | ICD-10-CM

## 2017-06-01 DIAGNOSIS — N644 Mastodynia: Secondary | ICD-10-CM

## 2017-07-04 ENCOUNTER — Ambulatory Visit
Admission: RE | Admit: 2017-07-04 | Discharge: 2017-07-04 | Disposition: A | Discharge status: Home / Self Care | Payer: Medicare Other | Source: Ambulatory Visit | Attending: Family Medicine | Admitting: Family Medicine

## 2017-07-04 DIAGNOSIS — N63 Unspecified lump in unspecified breast: Secondary | ICD-10-CM

## 2017-07-04 DIAGNOSIS — N644 Mastodynia: Secondary | ICD-10-CM

## 2017-07-04 DIAGNOSIS — M8589 Other specified disorders of bone density and structure, multiple sites: Secondary | ICD-10-CM | POA: Diagnosis not present

## 2017-07-04 DIAGNOSIS — N6489 Other specified disorders of breast: Secondary | ICD-10-CM | POA: Diagnosis not present

## 2017-07-04 DIAGNOSIS — R928 Other abnormal and inconclusive findings on diagnostic imaging of breast: Secondary | ICD-10-CM | POA: Diagnosis not present

## 2017-07-04 DIAGNOSIS — M8580 Other specified disorders of bone density and structure, unspecified site: Secondary | ICD-10-CM

## 2017-07-04 DIAGNOSIS — Z780 Asymptomatic menopausal state: Secondary | ICD-10-CM | POA: Diagnosis not present

## 2017-08-10 DIAGNOSIS — R002 Palpitations: Secondary | ICD-10-CM | POA: Diagnosis not present

## 2017-08-10 DIAGNOSIS — F411 Generalized anxiety disorder: Secondary | ICD-10-CM | POA: Diagnosis not present

## 2017-08-31 DIAGNOSIS — M7062 Trochanteric bursitis, left hip: Secondary | ICD-10-CM | POA: Diagnosis not present

## 2017-08-31 DIAGNOSIS — F411 Generalized anxiety disorder: Secondary | ICD-10-CM | POA: Diagnosis not present

## 2017-10-31 DIAGNOSIS — F40243 Fear of flying: Secondary | ICD-10-CM | POA: Diagnosis not present

## 2017-10-31 DIAGNOSIS — I10 Essential (primary) hypertension: Secondary | ICD-10-CM | POA: Diagnosis not present

## 2018-04-04 DIAGNOSIS — J069 Acute upper respiratory infection, unspecified: Secondary | ICD-10-CM | POA: Diagnosis not present

## 2018-05-02 DIAGNOSIS — I10 Essential (primary) hypertension: Secondary | ICD-10-CM | POA: Diagnosis not present

## 2018-05-02 DIAGNOSIS — R002 Palpitations: Secondary | ICD-10-CM | POA: Diagnosis not present

## 2018-05-02 DIAGNOSIS — Z0000 Encounter for general adult medical examination without abnormal findings: Secondary | ICD-10-CM | POA: Diagnosis not present

## 2018-05-02 DIAGNOSIS — Z1159 Encounter for screening for other viral diseases: Secondary | ICD-10-CM | POA: Diagnosis not present

## 2018-05-02 DIAGNOSIS — Z23 Encounter for immunization: Secondary | ICD-10-CM | POA: Diagnosis not present

## 2018-05-02 DIAGNOSIS — F411 Generalized anxiety disorder: Secondary | ICD-10-CM | POA: Diagnosis not present

## 2018-06-15 DIAGNOSIS — R0981 Nasal congestion: Secondary | ICD-10-CM | POA: Diagnosis not present

## 2018-06-15 DIAGNOSIS — R21 Rash and other nonspecific skin eruption: Secondary | ICD-10-CM | POA: Diagnosis not present

## 2018-06-19 ENCOUNTER — Ambulatory Visit: Payer: Federal, State, Local not specified - PPO | Admitting: Cardiology

## 2018-07-10 DIAGNOSIS — J3489 Other specified disorders of nose and nasal sinuses: Secondary | ICD-10-CM | POA: Diagnosis not present

## 2018-07-12 ENCOUNTER — Encounter: Payer: Self-pay | Admitting: Cardiology

## 2018-07-12 ENCOUNTER — Ambulatory Visit (INDEPENDENT_AMBULATORY_CARE_PROVIDER_SITE_OTHER): Payer: Medicare Other | Admitting: Cardiology

## 2018-07-12 VITALS — BP 138/86 | HR 83 | Ht 58.5 in | Wt 109.0 lb

## 2018-07-12 DIAGNOSIS — R0602 Shortness of breath: Secondary | ICD-10-CM | POA: Diagnosis not present

## 2018-07-12 DIAGNOSIS — I10 Essential (primary) hypertension: Secondary | ICD-10-CM

## 2018-07-12 DIAGNOSIS — Z7189 Other specified counseling: Secondary | ICD-10-CM | POA: Diagnosis not present

## 2018-07-12 DIAGNOSIS — R002 Palpitations: Secondary | ICD-10-CM | POA: Diagnosis not present

## 2018-07-12 NOTE — Patient Instructions (Signed)
Medication Instructions:  Your Physician recommend you continue on your current medication as directed.    If you need a refill on your cardiac medications before your next appointment, please call your pharmacy.   Lab work: None  Testing/Procedures: Your physician has requested that you have an echocardiogram. Echocardiography is a painless test that uses sound waves to create images of your heart. It provides your doctor with information about the size and shape of your heart and how well your heart's chambers and valves are working. This procedure takes approximately one hour. There are no restrictions for this procedure. [address]  Our physician has recommended that you wear an 7 DAY ZIO-PATCH monitor. The Zio patch cardiac monitor continuously records heart rhythm data for up to 14 days, this is for patients being evaluated for multiple types heart rhythms. For the first 24 hours post application, please avoid getting the Zio monitor wet in the shower or by excessive sweating during exercise. After that, feel free to carry on with regular activities. Keep soaps and lotions away from the ZIO XT Patch.   [facility] [address], [address].      Follow-Up: At [facility], you and your health needs are our priority.  As part of our continuing mission to provide you with exceptional heart care, we have created designated Provider Care Teams.  These Care Teams include your primary Cardiologist (physician) and Advanced Practice Providers (APPs -  Physician Assistants and Nurse Practitioners) who all work together to provide you with the care you need, when you need it. You will need a follow up appointment in 1 years.  Please call our office 2 months in advance to schedule this appointment.  You may see Dr. Cristal Deerhristopher or one of the following Advanced Practice Providers on your designated Care Team:   Theodore DemarkRhonda Barrett, PA-C . Joni ReiningKathryn  Lawrence, DNP, ANP

## 2018-07-12 NOTE — Progress Notes (Signed)
Cardiology Office Note:    Date:  07/12/2018   ID:  Paige Calhoun, DOB [birth year], MRN [id number]  PCP:  Clayborn Heron, MD  Cardiologist:  Jodelle Red, MD PhD  Referring MD: Clayborn Heron, MD   Chief Complaint  Patient presents with  . New Patient (Initial Visit)    History of Present Illness:    Paige Calhoun is a 67 y.o. female with a hx of anxiety, hypertension who is seen as a new consult at the request of Rankins, Fanny Dance, MD for the evaluation and management of palpitations, fatigue, and shortness of breath.  Tachycardia/palpitations: -Initial onset: 3-4 months -Frequency: notices more at night, not during the day. Nearly every night. -Duration: lasts the entire time until she falls asleep -Associated symptoms: tingling in the right foot -Aggravating/alleviating factors: better during the day/when she is active -Syncope/near syncope: none -Prior cardiac history: was told she had MVP back in the 1980s in [place] -Prior workup: stress test prior to her hysterectomy, in 2000, was normal.  -Prior treatment: none -Possible medication interactions: none -Caffeine: 1 cup of coffee in the morning, sometimes decaf -Alcohol: none -Tobacco: never -OTC supplements: vitamin C, vitamin D, vitamin E, multivitamins -Comorbidities: none -Exercise level: very active at baseline, cleans houses, watches dogs, babysits -Labs: TSH, kidney function/electrolytes, CBC reviewed. -Cardiac ROS: no PND, no orthopnea, no LE edema. -Family history: grandmother passed away from MI while walking to her bathroom, so she is concerned about this. Mother passed away at age 67 from CHF, maternal aunt has heart issues, cousin born with a hole in her heart. Father had liver failure, sister has diabetes and brain aneurysm.  Has to climb stairs to get to her unit. Has noticed that around the same period of time, she is more winded when she gets to the tops stair. Gets occasional sharp  pain under her ribcage when sitting/lying down. Had one episode when she was lying flat in bed with back tightness bilaterally that wrapped to the the front, self resolved after 2-3 hours. No associated symptoms with it. Had cleaned a house the day before and wondered if she overexerted herself.   Past Medical History:  Diagnosis Date  . Hypertension   . Vertigo     Past Surgical History:  Procedure Laterality Date  . ABDOMINAL HYSTERECTOMY    . APPENDECTOMY    . MENISCUS REPAIR Left     Current Medications: Current Outpatient Medications on File Prior to Visit  Medication Sig  . Bioflavonoid Products (SUPER C-500 COMPLEX PO) Take 1 tablet by mouth daily.  . chlorthalidone (HYGROTON) 25 MG tablet Take 25 mg by mouth every other day.  Marland Kitchen LORazepam (ATIVAN) 1 MG tablet Take 0.5 tablets (0.5 mg total) by mouth as needed for anxiety.  Marland Kitchen losartan-hydrochlorothiazide (HYZAAR) 100-12.5 MG tablet Take 1 tablet by mouth daily.  . Multiple Vitamins-Minerals (MULTIVITAMIN & MINERAL PO) Take 1 tablet by mouth daily.   No current facility-administered medications on file prior to visit.      Allergies:   Patient has no known allergies.   Social History   Socioeconomic History  . Marital status: Divorced    Spouse name: Not on file  . Number of children: Not on file  . Years of education: Not on file  . Highest education level: Not on file  Occupational History  . Not on file  Social Needs  . Financial resource strain: Not on file  . Food insecurity:    Worry: Not on  file    Inability: Not on file  . Transportation needs:    Medical: Not on file    Non-medical: Not on file  Tobacco Use  . Smoking status: Never Smoker  . Smokeless tobacco: Never Used  Substance and Sexual Activity  . Alcohol use: Not on file  . Drug use: Never  . Sexual activity: Not on file  Lifestyle  . Physical activity:    Days per week: Not on file    Minutes per session: Not on file  . Stress: Not on  file  Relationships  . Social connections:    Talks on phone: Not on file    Gets together: Not on file    Attends religious service: Not on file    Active member of club or organization: Not on file    Attends meetings of clubs or organizations: Not on file    Relationship status: Not on file  Other Topics Concern  . Not on file  Social History Narrative  . Not on file     Family History: grandmother passed away from MI while walking to her bathroom, so she is concerned about this. Mother passed away at age 85 from CHF, maternal aunt has heart issues, cousin born with a hole in her heart. Father had liver failure, sister has diabetes and brain aneurysm.  ROS:   Please see the history of present illness.  Additional pertinent ROS:  Constitutional: Negative for chills, fever, night sweats, unintentional weight loss  HENT: Negative for ear pain and hearing loss.   Eyes: Negative for loss of vision and eye pain.  Respiratory: Positive for dyspnea on exertion. Negative for cough, sputum, wheezing.   Cardiovascular: Positive for palpitations. Negative for chest pain, PND, orthopnea, lower extremity edema and claudication.  Gastrointestinal: Negative for abdominal pain, melena, and hematochezia.  Genitourinary: Negative for dysuria and hematuria.  Musculoskeletal: Negative for falls and myalgias.  Skin: Negative for itching and rash.  Neurological: Negative for focal weakness, focal sensory changes and loss of consciousness.  Endo/Heme/Allergies: Does not bruise/bleed easily.    EKGs/Labs/Other Studies Reviewed:    The following studies were reviewed today: Notes from Dr. Barbaraann Barthel  EKG:  EKG is personally reviewed.  The ekg ordered today demonstrates normal sinus rhythm with PVC  Recent Labs: No results found for requested labs within last 8760 hours.  Recent Lipid Panel No results found for: CHOL, TRIG, HDL, CHOLHDL, VLDL, LDLCALC, LDLDIRECT  Physical Exam:    VS:  BP 138/86    Pulse 83   Ht 4' 10.5" (1.486 m)   Wt 109 lb (49.4 kg)   BMI 22.39 kg/m     Wt Readings from Last 3 Encounters:  07/12/18 109 lb (49.4 kg)     GEN: Well nourished, well developed in no acute distress HEENT: Normal NECK: No JVD; No carotid bruits LYMPHATICS: No lymphadenopathy CARDIAC: regular rhythm, normal S1 and S2, no murmurs, rubs, gallops. Radial and DP pulses 2+ bilaterally. RESPIRATORY:  Clear to auscultation without rales, wheezing or rhonchi  ABDOMEN: Soft, non-tender, non-distended MUSCULOSKELETAL:  No edema; No deformity  SKIN: Warm and dry NEUROLOGIC:  Alert and oriented x 3 PSYCHIATRIC:  Normal affect   ASSESSMENT:    1. Palpitation   2. SOB (shortness of breath)   3. Essential hypertension   4. Counseling on health promotion and disease prevention    PLAN:    1. Palpitations: occur at night, does have PVC on ECG. Will be helpful to determine  if this is arrhythmia, frequency of PVCs -7 day Zio  2. Shortness of breath/dyspnea on exertion with progressive fatigue: reports history of MVP. Exam unremarkable today. Given that MVP can be associated with palpitations and structural abnormalities, will get echo. Chest/back pain atypical, but if echo shows WMA/abnormal EF would investigate further. -echocardiogram ordered  3. Primary prevention and health counseling -recommend heart healthy/Mediterranean diet, with whole grains, fruits, vegetable, fish, lean meats, nuts, and olive oil. Limit salt. -recommend moderate walking, 3-5 times/week for 30-50 minutes each session. Aim for at least 150 minutes.week. Goal should be pace of 3 miles/hours, or walking 1.5 miles in 30 minutes -recommend avoidance of tobacco products. Avoid excess alcohol. -Additional risk factor control:  -Diabetes: A1c is not available, no history  -Lipids: Tchol 235, LDL 143, HDL 61, TG 160  -Hypertension: slightly elevated today, was 112/78 at recent PCP visit  -Weight: BMI normal at  22 -ASCVD risk score: 11.8% with today's BP, 8.2% with prior BP reading at PCP  Plan for follow up: 1 year or sooner based on results of testing  TIME SPENT WITH PATIENT: >45 minutes of direct patient care. More than 50% of that time was spent on coordination of care and counseling regarding symptoms, evaluation, and potential management.Jodelle Red.  Paige Trunnell, MD, PhD [place]  [facility]   Medication Adjustments/Labs and Tests Ordered: Current medicines are reviewed at length with the patient today.  Concerns regarding medicines are outlined above.  Orders Placed This Encounter  Procedures  . LONG TERM MONITOR (3-14 DAYS)  . EKG 12-Lead  . ECHOCARDIOGRAM COMPLETE   No orders of the defined types were placed in this encounter.   Patient Instructions  Medication Instructions:  Your Physician recommend you continue on your current medication as directed.    If you need a refill on your cardiac medications before your next appointment, please call your pharmacy.   Lab work: None  Testing/Procedures: Your physician has requested that you have an echocardiogram. Echocardiography is a painless test that uses sound waves to create images of your heart. It provides your doctor with information about the size and shape of your heart and how well your heart's chambers and valves are working. This procedure takes approximately one hour. There are no restrictions for this procedure. [address]  Our physician has recommended that you wear an 7 DAY ZIO-PATCH monitor. The Zio patch cardiac monitor continuously records heart rhythm data for up to 14 days, this is for patients being evaluated for multiple types heart rhythms. For the first 24 hours post application, please avoid getting the Zio monitor wet in the shower or by excessive sweating during exercise. After that, feel free to carry on with regular activities. Keep soaps and lotions away from the ZIO XT  Patch.   [facility] [address], [address].      Follow-Up: At [facility], you and your health needs are our priority.  As part of our continuing mission to provide you with exceptional heart care, we have created designated Provider Care Teams.  These Care Teams include your primary Cardiologist (physician) and Advanced Practice Providers (APPs -  Physician Assistants and Nurse Practitioners) who all work together to provide you with the care you need, when you need it. You will need a follow up appointment in 1 years.  Please call our office 2 months in advance to schedule this appointment.  You  may see Dr. Cristal Deer or one of the following Advanced Practice Providers on your designated Care Team:   Theodore Demark, PA-C . Joni Reining, DNP, ANP        Signed, Jodelle Red, MD PhD 07/12/2018 9:54 AM    [facility]

## 2018-07-20 ENCOUNTER — Other Ambulatory Visit: Payer: Self-pay

## 2018-07-20 ENCOUNTER — Ambulatory Visit (HOSPITAL_COMMUNITY): Payer: Medicare Other | Attending: Cardiology

## 2018-07-20 ENCOUNTER — Ambulatory Visit (INDEPENDENT_AMBULATORY_CARE_PROVIDER_SITE_OTHER): Payer: Medicare Other

## 2018-07-20 DIAGNOSIS — R002 Palpitations: Secondary | ICD-10-CM | POA: Diagnosis not present

## 2018-07-20 DIAGNOSIS — R0602 Shortness of breath: Secondary | ICD-10-CM | POA: Diagnosis not present

## 2018-08-01 DIAGNOSIS — R002 Palpitations: Secondary | ICD-10-CM | POA: Diagnosis not present

## 2019-05-07 DIAGNOSIS — Z23 Encounter for immunization: Secondary | ICD-10-CM | POA: Diagnosis not present

## 2019-05-07 DIAGNOSIS — J3489 Other specified disorders of nose and nasal sinuses: Secondary | ICD-10-CM | POA: Diagnosis not present

## 2019-05-07 DIAGNOSIS — I10 Essential (primary) hypertension: Secondary | ICD-10-CM | POA: Diagnosis not present

## 2019-05-07 DIAGNOSIS — G479 Sleep disorder, unspecified: Secondary | ICD-10-CM | POA: Diagnosis not present

## 2019-05-07 DIAGNOSIS — F411 Generalized anxiety disorder: Secondary | ICD-10-CM | POA: Diagnosis not present

## 2019-05-07 DIAGNOSIS — Z0000 Encounter for general adult medical examination without abnormal findings: Secondary | ICD-10-CM | POA: Diagnosis not present

## 2019-05-07 DIAGNOSIS — H8101 Meniere's disease, right ear: Secondary | ICD-10-CM | POA: Diagnosis not present

## 2019-07-11 ENCOUNTER — Other Ambulatory Visit: Payer: Self-pay | Admitting: Family Medicine

## 2019-07-11 DIAGNOSIS — Z1231 Encounter for screening mammogram for malignant neoplasm of breast: Secondary | ICD-10-CM

## 2019-07-11 DIAGNOSIS — M810 Age-related osteoporosis without current pathological fracture: Secondary | ICD-10-CM

## 2019-08-07 ENCOUNTER — Telehealth: Payer: Self-pay | Admitting: *Deleted

## 2019-08-07 NOTE — Telephone Encounter (Signed)
A message was left, re: her follow up visit. 

## 2019-08-10 ENCOUNTER — Telehealth: Payer: Self-pay | Admitting: Cardiology

## 2019-08-10 NOTE — Telephone Encounter (Signed)
Copies made at pt's request and will get next week as has an appt with Dr Cristal Deer .Zack Seal

## 2019-08-10 NOTE — Telephone Encounter (Signed)
Patient calling requesting a copy of her last visit with Dr. Cristal Deer and a copy of her results sent to her.

## 2019-08-17 ENCOUNTER — Ambulatory Visit: Payer: Federal, State, Local not specified - PPO | Admitting: Cardiology

## 2019-08-23 ENCOUNTER — Telehealth: Payer: Self-pay

## 2019-08-23 NOTE — Telephone Encounter (Signed)
Patient requiested a copy of her Echo and zio patch results from 2019. Copy of records placed in Dr Di Kindle mail box and patient states she will pick them up when she comes for her next appt in February with Dr Cristal Deer. Patient thanked me for calling and voiced understanding.

## 2019-09-11 ENCOUNTER — Other Ambulatory Visit: Payer: Self-pay

## 2019-09-11 ENCOUNTER — Ambulatory Visit (INDEPENDENT_AMBULATORY_CARE_PROVIDER_SITE_OTHER): Payer: Medicare Other | Admitting: Cardiology

## 2019-09-11 ENCOUNTER — Encounter: Payer: Self-pay | Admitting: Cardiology

## 2019-09-11 VITALS — BP 138/85 | HR 78 | Ht <= 58 in | Wt 111.4 lb

## 2019-09-11 DIAGNOSIS — Z7189 Other specified counseling: Secondary | ICD-10-CM

## 2019-09-11 DIAGNOSIS — I10 Essential (primary) hypertension: Secondary | ICD-10-CM

## 2019-09-11 DIAGNOSIS — Z8249 Family history of ischemic heart disease and other diseases of the circulatory system: Secondary | ICD-10-CM

## 2019-09-11 DIAGNOSIS — R002 Palpitations: Secondary | ICD-10-CM

## 2019-09-11 NOTE — Progress Notes (Signed)
Cardiology Office Note:    Date:  09/11/2019   ID:  Armen Pickup, DOB 09/05/1950, MRN [id number]  PCP:  Clayborn Heron, MD  Cardiologist:  Jodelle Red, MD PhD  Referring MD: Clayborn Heron, MD   CC: follow up  History of Present Illness:    Paige Calhoun is a 69 y.o. female with a hx of anxiety, hypertension who is seen for follow up today. I initially saw her 07/12/2018 as a new consult at the request of Rankins, Fanny Dance, MD for the evaluation and management of palpitations, fatigue, and shortness of breath.  Family history: grandmother passed away from MI while walking to her bathroom, so she is concerned about this. Mother passed away at age 84 from CHF, maternal aunt has heart issues, cousin born with a hole in her heart. Father had liver failure, sister has diabetes and brain aneurysm.  Today: Doing well overall. Nightly palpitations have improved, very minimal now. No more shortness of breath. Fatigue seemed to be related to sleep.  Had her physical in Dr. Barbaraann Barthel in 05/2019. Started on trazodone for sleep, which has been helpful. Only downside is occasional strange dreams and some muscle aches. She cut back to 1/2 a pill and may cut back to melatonin only.   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 Goes up and down stairs many times/day without issues. Staying safe in the pandemic.   Blood pressure has been well controlled in office visits, stopped checking at home. Excellent diet and exercise.   Denies chest pain, shortness of breath at rest or with normal exertion. No PND, orthopnea, LE edema or unexpected weight gain. No syncope.  Past Medical History:  Diagnosis Date  . Hypertension   . Vertigo     Past Surgical History:  Procedure Laterality Date  . ABDOMINAL HYSTERECTOMY    . APPENDECTOMY    . MENISCUS REPAIR Left     Current Medications: Current Outpatient Medications on File Prior to Visit  Medication  Sig  . Bioflavonoid Products (SUPER C-500 COMPLEX PO) Take 1 tablet by mouth daily.  Marland Kitchen LORazepam (ATIVAN) 1 MG tablet Take 0.5 tablets (0.5 mg total) by mouth as needed for anxiety.  Marland Kitchen losartan-hydrochlorothiazide (HYZAAR) 100-12.5 MG tablet Take 1 tablet by mouth daily.  . Multiple Vitamins-Minerals (MULTIVITAMIN & MINERAL PO) Take 1 tablet by mouth daily.  Marland Kitchen olmesartan-hydrochlorothiazide (BENICAR HCT) 20-12.5 MG tablet Take 1 tablet by mouth daily.  . traZODone (DESYREL) 50 MG tablet Take 50 mg by mouth at bedtime.   No current facility-administered medications on file prior to visit.     Allergies:   Patient has no known allergies.   Social History   Tobacco Use  . Smoking status: Never Smoker  . Smokeless tobacco: Never Used  Substance Use Topics  . Alcohol use: Not on file  . Drug use: Never    Family History: grandmother passed away from MI while walking to her bathroom, so she is concerned about this. Mother passed away at age 44 from CHF, maternal aunt has heart issues, cousin born with a hole in her heart. Father had liver failure, sister has diabetes and brain aneurysm.  ROS:   Please see the history of present illness.  Additional pertinent ROS otherwise unremarkable.   EKGs/Labs/Other Studies Reviewed:    The following studies were reviewed today: Echo 07/20/18 Left ventricle: The cavity size was normal. Wall thickness was  normal. Systolic function was normal. The estimated ejection  fraction was in the range of 55% to 60%. Wall motion was normal;  there were no regional wall motion abnormalities. Doppler  parameters are consistent with abnormal left ventricular  relaxation (grade 1 diastolic dysfunction).  - Mitral valve: Calcified annulus.   Monitor 08/07/18 7 days of data recorded on Zio monitor. No VT, atrial fibrillation, high degree block, or pauses noted. Isolated atrial ectopy was rare (<1%). PVCs were occasional (1.8%), with rare couplets and  bigeminy/trigeminy. There were 4 patient triggered events, which were sinus rhythm with PAC or PVC. Patient had a min HR of 56 bpm, max HR of 162 bpm, and avg HR of 88 bpm. Predominant underlying rhythm was Sinus Rhythm. 1 run of Supraventricular Tachycardia occurred lasting 12 beats with a max rate of 113 bpm (avg 91 bpm).   EKG:  EKG is personally reviewed.  The ekg ordered today demonstrates normal sinus rhythm   Recent Labs: No results found for requested labs within last 8760 hours.  Recent Lipid Panel No results found for: CHOL, TRIG, HDL, CHOLHDL, VLDL, LDLCALC, LDLDIRECT  Physical Exam:    VS:  BP 138/85   Pulse 78   Ht 4\' 10"  (1.473 m)   Wt 111 lb 6.4 oz (50.5 kg)   SpO2 99%   BMI 23.28 kg/m     Wt Readings from Last 3 Encounters:  09/11/19 111 lb 6.4 oz (50.5 kg)  07/12/18 109 lb (49.4 kg)    GEN: Well nourished, well developed in no acute distress HEENT: Normal, moist mucous membranes NECK: No JVD CARDIAC: regular rhythm, normal S1 and S2, no rubs or gallops. No murmur. VASCULAR: Radial and DP pulses 2+ bilaterally. No carotid bruits RESPIRATORY:  Clear to auscultation without rales, wheezing or rhonchi  ABDOMEN: Soft, non-tender, non-distended MUSCULOSKELETAL:  Ambulates independently SKIN: Warm and dry, no edema NEUROLOGIC:  Alert and oriented x 3. No focal neuro deficits noted. PSYCHIATRIC:  Normal affect   ASSESSMENT:    1. Palpitation   2. Essential hypertension   3. Counseling on health promotion and disease prevention   4. Cardiac risk counseling   5. Family history of heart disease    PLAN:    Palpitations: improved -monitor with rare (<2%) PVCs -echo without structural abnormalities  Shortness of breath/dyspnea on exertion: resolved  Hypertension: slightly above goal today, no longer checking at home. Reports that it has been well controlled at recent visits -continue olmesartan-HCTZ  Primary prevention and health counseling: with family  history of heart disease -recommend heart healthy/Mediterranean diet, with whole grains, fruits, vegetable, fish, lean meats, nuts, and olive oil. Limit salt. -recommend moderate walking, 3-5 times/week for 30-50 minutes each session. Aim for at least 150 minutes.week. Goal should be pace of 3 miles/hours, or walking 1.5 miles in 30 minutes -recommend avoidance of tobacco products. Avoid excess alcohol. -Additional risk factor control:  -Diabetes: A1c is not available, no history  -Lipids: from [facility] 05/2019: Tchol 233, LDL 152, HDL 59, TG 112  -Hypertension: as above  -Weight: BMI normal at 2 -ASCVD risk score: elevated. We have discussed statin for primary prevention given her comorbidities and risk score. Her lipids are above goal (would prefer LDL ~100). She will think about this and contact me or Dr. Radene Ou if she wishes to start.  Plan for follow up: every 2 years or sooner as needed  Buford Dresser, MD, PhD [place]  [facility]   Medication Adjustments/Labs and Tests Ordered: Current medicines are reviewed at length with the patient  today.  Concerns regarding medicines are outlined above.  No orders of the defined types were placed in this encounter.  No orders of the defined types were placed in this encounter.   Patient Instructions  Medication Instructions:  Your Physician recommend you continue on your current medication as directed.    *If you need a refill on your cardiac medications before your next appointment, please call your pharmacy*  Lab Work: None  Testing/Procedures: None  Follow-Up: At [facility], you and your health needs are our priority.  As part of our continuing mission to provide you with exceptional heart care, we have created designated Provider Care Teams.  These Care Teams include your primary Cardiologist (physician) and Advanced Practice Providers (APPs -  Physician Assistants and Nurse Practitioners) who all work together to  provide you with the care you need, when you need it.  Your next appointment:   2 year(s)  The format for your next appointment:   In Person  Provider:   Jodelle Red, MD       Signed, Jodelle Red, MD PhD 09/11/2019    [facility]

## 2019-09-11 NOTE — Patient Instructions (Signed)
Medication Instructions:  Your Physician recommend you continue on your current medication as directed.    *If you need a refill on your cardiac medications before your next appointment, please call your pharmacy*  Lab Work: None  Testing/Procedures: None  Follow-Up: At [facility], you and your health needs are our priority.  As part of our continuing mission to provide you with exceptional heart care, we have created designated Provider Care Teams.  These Care Teams include your primary Cardiologist (physician) and Advanced Practice Providers (APPs -  Physician Assistants and Nurse Practitioners) who all work together to provide you with the care you need, when you need it.  Your next appointment:   2 year(s)  The format for your next appointment:   In Person  Provider:   Bridgette Christopher, MD   

## 2019-09-24 DIAGNOSIS — M545 Low back pain: Secondary | ICD-10-CM | POA: Diagnosis not present

## 2019-09-24 DIAGNOSIS — M25532 Pain in left wrist: Secondary | ICD-10-CM | POA: Diagnosis not present

## 2019-10-08 ENCOUNTER — Other Ambulatory Visit: Payer: Self-pay

## 2019-10-08 ENCOUNTER — Ambulatory Visit
Admission: RE | Admit: 2019-10-08 | Discharge: 2019-10-08 | Disposition: A | Discharge status: Home / Self Care | Payer: Medicare Other | Source: Ambulatory Visit | Attending: Family Medicine | Admitting: Family Medicine

## 2019-10-08 DIAGNOSIS — M85852 Other specified disorders of bone density and structure, left thigh: Secondary | ICD-10-CM | POA: Diagnosis not present

## 2019-10-08 DIAGNOSIS — Z1231 Encounter for screening mammogram for malignant neoplasm of breast: Secondary | ICD-10-CM | POA: Diagnosis not present

## 2019-10-08 DIAGNOSIS — Z780 Asymptomatic menopausal state: Secondary | ICD-10-CM | POA: Diagnosis not present

## 2019-10-08 DIAGNOSIS — M810 Age-related osteoporosis without current pathological fracture: Secondary | ICD-10-CM | POA: Diagnosis not present

## 2019-10-08 HISTORY — DX: Other signs and symptoms in breast: N64.59

## 2019-10-15 DIAGNOSIS — M810 Age-related osteoporosis without current pathological fracture: Secondary | ICD-10-CM | POA: Diagnosis not present

## 2019-11-08 ENCOUNTER — Encounter: Payer: Self-pay | Admitting: Cardiology

## 2019-12-04 NOTE — Addendum Note (Signed)
Addended by: Brunetta Genera on: 12/04/2019 01:20 PM   Modules accepted: Orders

## 2020-05-13 DIAGNOSIS — F411 Generalized anxiety disorder: Secondary | ICD-10-CM | POA: Diagnosis not present

## 2020-05-13 DIAGNOSIS — M549 Dorsalgia, unspecified: Secondary | ICD-10-CM | POA: Diagnosis not present

## 2020-05-13 DIAGNOSIS — G479 Sleep disorder, unspecified: Secondary | ICD-10-CM | POA: Diagnosis not present

## 2020-05-13 DIAGNOSIS — M810 Age-related osteoporosis without current pathological fracture: Secondary | ICD-10-CM | POA: Diagnosis not present

## 2020-05-13 DIAGNOSIS — I10 Essential (primary) hypertension: Secondary | ICD-10-CM | POA: Diagnosis not present

## 2020-05-13 DIAGNOSIS — Z23 Encounter for immunization: Secondary | ICD-10-CM | POA: Diagnosis not present

## 2020-05-13 DIAGNOSIS — Z0000 Encounter for general adult medical examination without abnormal findings: Secondary | ICD-10-CM | POA: Diagnosis not present

## 2020-06-25 DIAGNOSIS — Z03818 Encounter for observation for suspected exposure to other biological agents ruled out: Secondary | ICD-10-CM | POA: Diagnosis not present

## 2020-06-25 DIAGNOSIS — J3489 Other specified disorders of nose and nasal sinuses: Secondary | ICD-10-CM | POA: Diagnosis not present

## 2020-07-07 DIAGNOSIS — M533 Sacrococcygeal disorders, not elsewhere classified: Secondary | ICD-10-CM | POA: Diagnosis not present

## 2020-08-05 DIAGNOSIS — Z20822 Contact with and (suspected) exposure to covid-19: Secondary | ICD-10-CM | POA: Diagnosis not present

## 2020-08-29 DIAGNOSIS — Z23 Encounter for immunization: Secondary | ICD-10-CM | POA: Diagnosis not present

## 2020-10-03 DIAGNOSIS — M533 Sacrococcygeal disorders, not elsewhere classified: Secondary | ICD-10-CM | POA: Diagnosis not present

## 2020-10-20 DIAGNOSIS — M5137 Other intervertebral disc degeneration, lumbosacral region: Secondary | ICD-10-CM | POA: Diagnosis not present

## 2020-10-20 DIAGNOSIS — M5459 Other low back pain: Secondary | ICD-10-CM | POA: Diagnosis not present

## 2020-10-27 DIAGNOSIS — M5137 Other intervertebral disc degeneration, lumbosacral region: Secondary | ICD-10-CM | POA: Diagnosis not present

## 2020-10-27 DIAGNOSIS — M5459 Other low back pain: Secondary | ICD-10-CM | POA: Diagnosis not present

## 2020-11-06 DIAGNOSIS — M5137 Other intervertebral disc degeneration, lumbosacral region: Secondary | ICD-10-CM | POA: Diagnosis not present

## 2020-11-06 DIAGNOSIS — M5459 Other low back pain: Secondary | ICD-10-CM | POA: Diagnosis not present

## 2020-11-10 DIAGNOSIS — M5137 Other intervertebral disc degeneration, lumbosacral region: Secondary | ICD-10-CM | POA: Diagnosis not present

## 2020-11-10 DIAGNOSIS — M5459 Other low back pain: Secondary | ICD-10-CM | POA: Diagnosis not present

## 2020-11-12 DIAGNOSIS — M5137 Other intervertebral disc degeneration, lumbosacral region: Secondary | ICD-10-CM | POA: Diagnosis not present

## 2020-11-12 DIAGNOSIS — M5459 Other low back pain: Secondary | ICD-10-CM | POA: Diagnosis not present

## 2020-11-17 DIAGNOSIS — M5459 Other low back pain: Secondary | ICD-10-CM | POA: Diagnosis not present

## 2020-11-17 DIAGNOSIS — M5137 Other intervertebral disc degeneration, lumbosacral region: Secondary | ICD-10-CM | POA: Diagnosis not present

## 2020-11-20 DIAGNOSIS — M5137 Other intervertebral disc degeneration, lumbosacral region: Secondary | ICD-10-CM | POA: Diagnosis not present

## 2020-11-20 DIAGNOSIS — M5459 Other low back pain: Secondary | ICD-10-CM | POA: Diagnosis not present

## 2020-11-24 DIAGNOSIS — M5459 Other low back pain: Secondary | ICD-10-CM | POA: Diagnosis not present

## 2020-11-24 DIAGNOSIS — M5137 Other intervertebral disc degeneration, lumbosacral region: Secondary | ICD-10-CM | POA: Diagnosis not present

## 2020-11-27 DIAGNOSIS — M5459 Other low back pain: Secondary | ICD-10-CM | POA: Diagnosis not present

## 2020-11-27 DIAGNOSIS — M5137 Other intervertebral disc degeneration, lumbosacral region: Secondary | ICD-10-CM | POA: Diagnosis not present

## 2020-12-04 DIAGNOSIS — M5459 Other low back pain: Secondary | ICD-10-CM | POA: Diagnosis not present

## 2020-12-04 DIAGNOSIS — M5137 Other intervertebral disc degeneration, lumbosacral region: Secondary | ICD-10-CM | POA: Diagnosis not present

## 2020-12-08 DIAGNOSIS — M545 Low back pain, unspecified: Secondary | ICD-10-CM | POA: Diagnosis not present

## 2020-12-08 DIAGNOSIS — G8929 Other chronic pain: Secondary | ICD-10-CM | POA: Diagnosis not present

## 2020-12-16 DIAGNOSIS — M5459 Other low back pain: Secondary | ICD-10-CM | POA: Diagnosis not present

## 2020-12-25 DIAGNOSIS — M47816 Spondylosis without myelopathy or radiculopathy, lumbar region: Secondary | ICD-10-CM | POA: Diagnosis not present

## 2021-01-09 DIAGNOSIS — M5416 Radiculopathy, lumbar region: Secondary | ICD-10-CM | POA: Diagnosis not present

## 2021-03-13 DIAGNOSIS — M47896 Other spondylosis, lumbar region: Secondary | ICD-10-CM | POA: Diagnosis not present

## 2021-03-18 DIAGNOSIS — L259 Unspecified contact dermatitis, unspecified cause: Secondary | ICD-10-CM | POA: Diagnosis not present

## 2021-05-01 DIAGNOSIS — I10 Essential (primary) hypertension: Secondary | ICD-10-CM | POA: Diagnosis not present

## 2021-05-01 DIAGNOSIS — M810 Age-related osteoporosis without current pathological fracture: Secondary | ICD-10-CM | POA: Diagnosis not present

## 2021-05-01 DIAGNOSIS — G8929 Other chronic pain: Secondary | ICD-10-CM | POA: Diagnosis not present

## 2021-05-07 DIAGNOSIS — M47896 Other spondylosis, lumbar region: Secondary | ICD-10-CM | POA: Diagnosis not present

## 2021-05-07 DIAGNOSIS — M47816 Spondylosis without myelopathy or radiculopathy, lumbar region: Secondary | ICD-10-CM | POA: Diagnosis not present

## 2021-05-21 DIAGNOSIS — M47817 Spondylosis without myelopathy or radiculopathy, lumbosacral region: Secondary | ICD-10-CM | POA: Diagnosis not present

## 2021-06-10 ENCOUNTER — Other Ambulatory Visit: Payer: Self-pay | Admitting: Family Medicine

## 2021-06-10 DIAGNOSIS — Z23 Encounter for immunization: Secondary | ICD-10-CM | POA: Diagnosis not present

## 2021-06-10 DIAGNOSIS — Z1231 Encounter for screening mammogram for malignant neoplasm of breast: Secondary | ICD-10-CM

## 2021-06-10 DIAGNOSIS — Z136 Encounter for screening for cardiovascular disorders: Secondary | ICD-10-CM | POA: Diagnosis not present

## 2021-06-10 DIAGNOSIS — Z0000 Encounter for general adult medical examination without abnormal findings: Secondary | ICD-10-CM | POA: Diagnosis not present

## 2021-06-10 DIAGNOSIS — I10 Essential (primary) hypertension: Secondary | ICD-10-CM | POA: Diagnosis not present

## 2021-06-15 DIAGNOSIS — M47816 Spondylosis without myelopathy or radiculopathy, lumbar region: Secondary | ICD-10-CM | POA: Diagnosis not present

## 2021-06-15 DIAGNOSIS — M47896 Other spondylosis, lumbar region: Secondary | ICD-10-CM | POA: Diagnosis not present

## 2021-06-23 DIAGNOSIS — Z20822 Contact with and (suspected) exposure to covid-19: Secondary | ICD-10-CM | POA: Diagnosis not present

## 2021-07-10 DIAGNOSIS — M47896 Other spondylosis, lumbar region: Secondary | ICD-10-CM | POA: Diagnosis not present

## 2021-07-10 DIAGNOSIS — M47816 Spondylosis without myelopathy or radiculopathy, lumbar region: Secondary | ICD-10-CM | POA: Diagnosis not present

## 2021-08-01 DIAGNOSIS — M533 Sacrococcygeal disorders, not elsewhere classified: Secondary | ICD-10-CM | POA: Diagnosis not present

## 2021-08-01 DIAGNOSIS — M47816 Spondylosis without myelopathy or radiculopathy, lumbar region: Secondary | ICD-10-CM | POA: Diagnosis not present

## 2021-08-04 DIAGNOSIS — J069 Acute upper respiratory infection, unspecified: Secondary | ICD-10-CM | POA: Diagnosis not present

## 2021-08-19 ENCOUNTER — Ambulatory Visit: Payer: Federal, State, Local not specified - PPO

## 2021-08-19 DIAGNOSIS — M533 Sacrococcygeal disorders, not elsewhere classified: Secondary | ICD-10-CM | POA: Diagnosis not present

## 2021-09-01 ENCOUNTER — Ambulatory Visit (INDEPENDENT_AMBULATORY_CARE_PROVIDER_SITE_OTHER): Payer: Medicare Other | Admitting: Cardiology

## 2021-09-01 ENCOUNTER — Other Ambulatory Visit: Payer: Self-pay

## 2021-09-01 ENCOUNTER — Encounter (HOSPITAL_BASED_OUTPATIENT_CLINIC_OR_DEPARTMENT_OTHER): Payer: Self-pay | Admitting: Cardiology

## 2021-09-01 VITALS — BP 118/80 | HR 83 | Ht <= 58 in | Wt 110.3 lb

## 2021-09-01 DIAGNOSIS — E782 Mixed hyperlipidemia: Secondary | ICD-10-CM | POA: Diagnosis not present

## 2021-09-01 DIAGNOSIS — Z7189 Other specified counseling: Secondary | ICD-10-CM

## 2021-09-01 DIAGNOSIS — R0609 Other forms of dyspnea: Secondary | ICD-10-CM | POA: Diagnosis not present

## 2021-09-01 DIAGNOSIS — R002 Palpitations: Secondary | ICD-10-CM

## 2021-09-01 DIAGNOSIS — I10 Essential (primary) hypertension: Secondary | ICD-10-CM | POA: Diagnosis not present

## 2021-09-01 NOTE — Progress Notes (Signed)
Cardiology Office Note:    Date:  09/01/2021   ID:  Armen Pickup, DOB [birth year], MRN [id number]  PCP:  Clayborn Heron, MD  Cardiologist:  Jodelle Red, MD PhD  Referring MD: Clayborn Heron, MD   CC: follow up  History of Present Illness:    Paige Calhoun is a 70 y.o. female with a hx of anxiety, hypertension who is seen for follow up today. I initially saw her 07/12/2018 as a new consult at the request of Rankins, Fanny Dance, MD for the evaluation and management of palpitations, fatigue, and shortness of breath.  Family history: grandmother passed away from MI while walking to her bathroom, so she is concerned about this. Mother passed away at age 36 from CHF, maternal aunt has heart issues, cousin born with a hole in her heart. Father had liver failure, sister has diabetes and brain aneurysm.  Today: Had RFA of her back recently, has some improvement in her pain. Able to walk around yesterday. Does still get out of breath with walking her steps throughout the day.   Has palpitations at night, helped by lorazepam. Reviewed prior monitor and echo.  She remains active, cleaning houses, dog sitting, etc. Reviewed red flag signs that need immediate medical attention.  We reviewed her recent cholesterol, discussed plaque and risk, see below.  Denies chest pain, shortness of breath at rest or with normal exertion. No PND, orthopnea, LE edema or unexpected weight gain. No syncope.  Past Medical History:  Diagnosis Date   Hypertension    Inverted nipple x years   bilateral   Vertigo     Past Surgical History:  Procedure Laterality Date   ABDOMINAL HYSTERECTOMY     APPENDECTOMY     MENISCUS REPAIR Left     Current Medications: Current Outpatient Medications on File Prior to Visit  Medication Sig   alendronate (FOSAMAX) 70 MG tablet Take 70 mg by mouth once a week.   Bioflavonoid Products (SUPER C-500 COMPLEX PO) Take 1 tablet by mouth daily.   LORazepam  (ATIVAN) 1 MG tablet Take 0.5 tablets (0.5 mg total) by mouth as needed for anxiety.   Multiple Vitamins-Minerals (MULTIVITAMIN & MINERAL PO) Take 1 tablet by mouth daily.   olmesartan-hydrochlorothiazide (BENICAR HCT) 20-12.5 MG tablet Take 1 tablet by mouth daily.   traZODone (DESYREL) 50 MG tablet Take 50 mg by mouth at bedtime. (Patient not taking: Reported on 09/01/2021)   No current facility-administered medications on file prior to visit.     Allergies:   Patient has no known allergies.   Social History   Tobacco Use   Smoking status: Never   Smokeless tobacco: Never  Substance Use Topics   Drug use: Never    Family History: grandmother passed away from MI while walking to her bathroom, so she is concerned about this. Mother passed away at age 17 from CHF, maternal aunt has heart issues, cousin born with a hole in her heart. Father had liver failure, sister has diabetes and brain aneurysm. Cousin had MI at 50.   ROS:   Please see the history of present illness.  Additional pertinent ROS otherwise unremarkable.   EKGs/Labs/Other Studies Reviewed:    The following studies were reviewed today: Echo 07/20/18 Left ventricle: The cavity size was normal. Wall thickness was    normal. Systolic function was normal. The estimated ejection    fraction was in the range of 55% to 60%. Wall motion was normal;    there were no  regional wall motion abnormalities. Doppler    parameters are consistent with abnormal left ventricular    relaxation (grade 1 diastolic dysfunction).  - Mitral valve: Calcified annulus.   Monitor 08/07/18 7 days of data recorded on Zio monitor. No VT, atrial fibrillation, high degree block, or pauses noted. Isolated atrial ectopy was rare (<1%). PVCs were occasional (1.8%), with rare couplets and bigeminy/trigeminy. There were 4 patient triggered events, which were sinus rhythm with PAC or PVC. Patient had a min HR of 56 bpm, max HR of 162 bpm, and avg HR of 88  bpm. Predominant underlying rhythm was Sinus Rhythm. 1 run of Supraventricular Tachycardia occurred lasting 12 beats with a max rate of 113 bpm (avg 91 bpm).   EKG:  EKG is personally reviewed.   09/01/21: NSR at 83 bpm  Recent Labs: No results found for requested labs within last 8760 hours.  Recent Lipid Panel No results found for: CHOL, TRIG, HDL, CHOLHDL, VLDL, LDLCALC, LDLDIRECT  Physical Exam:    VS:  BP 118/80    Pulse 83    Ht 4\' 10"  (1.473 m)    Wt 110 lb 4.8 oz (50 kg)    SpO2 95%    BMI 23.05 kg/m     Wt Readings from Last 3 Encounters:  09/01/21 110 lb 4.8 oz (50 kg)  09/11/19 111 lb 6.4 oz (50.5 kg)  07/12/18 109 lb (49.4 kg)    GEN: Well nourished, well developed in no acute distress HEENT: Normal, moist mucous membranes NECK: No JVD CARDIAC: regular rhythm, normal S1 and S2, no rubs or gallops. No murmur. VASCULAR: Radial and DP pulses 2+ bilaterally. No carotid bruits RESPIRATORY:  Clear to auscultation without rales, wheezing or rhonchi  ABDOMEN: Soft, non-tender, non-distended MUSCULOSKELETAL:  Ambulates independently SKIN: Warm and dry, no edema NEUROLOGIC:  Alert and oriented x 3. No focal neuro deficits noted. PSYCHIATRIC:  Normal affect    ASSESSMENT:    1. Essential hypertension   2. Mixed hyperlipidemia   3. Dyspnea on exertion   4. Cardiac risk counseling   5. Counseling on health promotion and disease prevention   6. Heart palpitations     PLAN:    Palpitations: mostly at night, nonlimiting -monitor with rare (<2%) PVCs -echo without structural abnormalities  Shortness of breath/dyspnea on exertion: discussed today. Suspect primarily deconditioning, has been limited due to her back issues. Working to gradually increase her activity -discussed red flag warning signs that need immediate medical attention.  Hypertension: at goal -continue olmesartan-HCTZ  Hypercholesterolemia/mixed hyperlipidemia -[facility]: Tchol 266, HDL 61, LDL  172, TG 178 -we discussed calcium score and ASCVD risk today -she started taking red yeast rice. Discussed recent trial data on this.  Primary prevention and health counseling: with family history of heart disease -recommend heart healthy/Mediterranean diet, with whole grains, fruits, vegetable, fish, lean meats, nuts, and olive oil. Limit salt. -recommend moderate walking, 3-5 times/week for 30-50 minutes each session. Aim for at least 150 minutes.week. Goal should be pace of 3 miles/hours, or walking 1.5 miles in 30 minutes -recommend avoidance of tobacco products. Avoid excess alcohol. -ASCVD risk score: elevated. We have discussed statin for primary prevention given her comorbidities and risk score. Her lipids are above goal (would prefer LDL ~100). She will think about this and contact me or Dr. Barbaraann Barthelankins if she wishes to start.  Plan for follow up: every 2 years or sooner as needed  Jodelle RedBridgette Aleiya Rye, MD, PhD [facility]  [facility]   Medication Adjustments/Labs and Tests Ordered: Current medicines are reviewed at length with the patient today.  Concerns regarding medicines are outlined above.  Orders Placed This Encounter  Procedures   CT CARDIAC SCORING (SELF PAY ONLY)   EKG 12-Lead   No orders of the defined types were placed in this encounter.   Patient Instructions  Medication Instructions:  Your Physician recommend you continue on your current medication as directed.    *If you need a refill on your cardiac medications before your next appointment, please call your pharmacy*   Lab Work: None ordered today   Testing/Procedures: CT coronary calcium score.   Test locations:  [facility] ([address] 3rd Floor [place], [place] [address]) [facility] ([address] [place], [place] [address])   This is $99 out of pocket.   Coronary CalciumScan A coronary calcium scan is an imaging test used to look for deposits of calcium and other fatty  materials (plaques) in the inner lining of the blood vessels of the heart (coronary arteries). These deposits of calcium and plaques can partly clog and narrow the coronary arteries without producing any symptoms or warning signs. This puts a person at risk for a heart attack. This test can detect these deposits before symptoms develop. Tell a health care provider about: Any allergies you have. All medicines you are taking, including vitamins, herbs, eye drops, creams, and over-the-counter medicines. Any problems you or family members have had with anesthetic medicines. Any blood disorders you have. Any surgeries you have had. Any medical conditions you have. Whether you are pregnant or may be pregnant. What are the risks? Generally, this is a safe procedure. However, problems may occur, including: Harm to a pregnant woman and her unborn baby. This test involves the use of radiation. Radiation exposure can be dangerous to a pregnant woman and her unborn baby. If you are pregnant, you generally should not have this procedure done. Slight increase in the risk of cancer. This is because of the radiation involved in the test. What happens before the procedure? No preparation is needed for this procedure. What happens during the procedure? You will undress and remove any jewelry around your neck or chest. You will put on a hospital gown. Sticky electrodes will be placed on your chest. The electrodes will be connected to an electrocardiogram (ECG) machine to record a tracing of the electrical activity of your heart. A CT scanner will take pictures of your heart. During this time, you will be asked to lie still and hold your breath for 2-3 seconds while a picture of your heart is being taken. The procedure may vary among health care providers and hospitals. What happens after the procedure? You can get dressed. You can return to your normal activities. It is up to you to get the results of your  test. Ask your health care provider, or the department that is doing the test, when your results will be ready. Summary A coronary calcium scan is an imaging test used to look for deposits of calcium and other fatty materials (plaques) in the inner lining of the blood vessels of the heart (coronary arteries). Generally, this is a safe procedure. Tell your health care provider if you are pregnant or may be pregnant. No preparation is needed for this procedure. A CT scanner will take pictures of your heart. You can return to your normal activities after the scan is done. This information is not intended to replace advice given to  you by your health care provider. Make sure you discuss any questions you have with your health care provider. Document Released: 01/15/2008 Document Revised: 06/07/2016 Document Reviewed: 06/07/2016 Elsevier Interactive Patient Education  2017 [employer]    Follow-Up: At [facility], you and your health needs are our priority.  As part of our continuing mission to provide you with exceptional heart care, we have created designated Provider Care Teams.  These Care Teams include your primary Cardiologist (physician) and Advanced Practice Providers (APPs -  Physician Assistants and Nurse Practitioners) who all work together to provide you with the care you need, when you need it.  We recommend signing up for the patient portal called "MyChart".  Sign up information is provided on this After Visit Summary.  MyChart is used to connect with patients for Virtual Visits (Telemedicine).  Patients are able to view lab/test results, encounter notes, upcoming appointments, etc.  Non-urgent messages can be sent to your provider as well.   To learn more about what you can do with MyChart, go to [email or web address].    Your next appointment:   2 year(s)  The format for your next appointment:   In Person  Provider:   Jodelle RedBridgette Alla Sloma, MD    Recent data,  summarized from [employer] from a study from the [facility]:  CropWizard.com.pthttps://www.npr.org/sections/health-shots/2021/06/07/330-832-3770/statins-vs-supplements-new-study-finds-one-is-vastly-superior-to-cut-cholesterol  "Researchers at the [facility] set out to answer this question by comparing statins to supplements in a clinical trial. They tracked the outcomes of 190 adults, ages 3440 to 6475. Some participants were given a 5 mg daily dose of rosuvastatin, a statin that is sold under the brand name Crestor for 28 days. Others were given supplements, including fish oil, cinnamon, garlic, turmeric, plant sterols or red yeast rice for the same period."  "What we found was that rosuvastatin lowered LDL cholesterol by almost 16%38% and that was vastly superior to placebo and any of the six supplements studied in the trial," study Kayren Eavesauthor Luke Laffin, M.D. of the [facility] told NPR. He says this level of reduction is enough to lower the risk of heart attacks and strokes. The findings are published in the Journal of the [employer] College of Cardiology.  "Oftentimes these supplements are marketed as 'natural ways' to lower your cholesterol," says Laffin. But he says none of the dietary supplements demonstrated any significant decrease in LDL cholesterol compared with a placebo. LDL cholesterol is considered the 'bad cholesterol' because it can contribute to plaque build-up in the artery walls - which can narrow the arteries, and set the stage for heart attacks and strokes"   Signed, Jodelle RedBridgette Josuel Koeppen, MD PhD 09/01/2021    [facility]

## 2021-09-01 NOTE — Patient Instructions (Signed)
Medication Instructions:  Your Physician recommend you continue on your current medication as directed.    *If you need a refill on your cardiac medications before your next appointment, please call your pharmacy*   Lab Work: None ordered today   Testing/Procedures: CT coronary calcium score.   Test locations:  [facility] ([address] [place], [place] [address]) [facility] ([address] [place], [place] [address])   This is $99 out of pocket.   Coronary CalciumScan A coronary calcium scan is an imaging test used to look for deposits of calcium and other fatty materials (plaques) in the inner lining of the blood vessels of the heart (coronary arteries). These deposits of calcium and plaques can partly clog and narrow the coronary arteries without producing any symptoms or warning signs. This puts a person at risk for a heart attack. This test can detect these deposits before symptoms develop. Tell a health care provider about: Any allergies you have. All medicines you are taking, including vitamins, herbs, eye drops, creams, and over-the-counter medicines. Any problems you or family members have had with anesthetic medicines. Any blood disorders you have. Any surgeries you have had. Any medical conditions you have. Whether you are pregnant or may be pregnant. What are the risks? Generally, this is a safe procedure. However, problems may occur, including: Harm to a pregnant woman and her unborn baby. This test involves the use of radiation. Radiation exposure can be dangerous to a pregnant woman and her unborn baby. If you are pregnant, you generally should not have this procedure done. Slight increase in the risk of cancer. This is because of the radiation involved in the test. What happens before the procedure? No preparation is needed for this procedure. What happens during the procedure? You will undress and remove any jewelry around your neck or  chest. You will put on a hospital gown. Sticky electrodes will be placed on your chest. The electrodes will be connected to an electrocardiogram (ECG) machine to record a tracing of the electrical activity of your heart. A CT scanner will take pictures of your heart. During this time, you will be asked to lie still and hold your breath for 2-3 seconds while a picture of your heart is being taken. The procedure may vary among health care providers and hospitals. What happens after the procedure? You can get dressed. You can return to your normal activities. It is up to you to get the results of your test. Ask your health care provider, or the department that is doing the test, when your results will be ready. Summary A coronary calcium scan is an imaging test used to look for deposits of calcium and other fatty materials (plaques) in the inner lining of the blood vessels of the heart (coronary arteries). Generally, this is a safe procedure. Tell your health care provider if you are pregnant or may be pregnant. No preparation is needed for this procedure. A CT scanner will take pictures of your heart. You can return to your normal activities after the scan is done. This information is not intended to replace advice given to you by your health care provider. Make sure you discuss any questions you have with your health care provider. Document Released: 01/15/2008 Document Revised: 06/07/2016 Document Reviewed: 06/07/2016 Elsevier Interactive Patient Education  2017 [place]: At [facility], you and your health needs are our priority.  As part of our continuing mission to provide you with exceptional heart  care, we have created designated Provider Care Teams.  These Care Teams include your primary Cardiologist (physician) and Advanced Practice Providers (APPs -  Physician Assistants and Nurse Practitioners) who all work together to provide you with the care you need, when you  need it.  We recommend signing up for the patient portal called "MyChart".  Sign up information is provided on this After Visit Summary.  MyChart is used to connect with patients for Virtual Visits (Telemedicine).  Patients are able to view lab/test results, encounter notes, upcoming appointments, etc.  Non-urgent messages can be sent to your provider as well.   To learn more about what you can do with MyChart, go to [email or web address].    Your next appointment:   2 year(s)  The format for your next appointment:   In Person  Provider:   Buford Dresser, MD    Recent data, summarized from [place] from a study from the [facility]:  https://wilkins.info/  "Researchers at the [facility] set out to answer this question by comparing statins to supplements in a clinical trial. They tracked the outcomes of 190 adults, ages 108 to 36. Some participants were given a 5 mg daily dose of rosuvastatin, a statin that is sold under the brand name Crestor for 28 days. Others were given supplements, including fish oil, cinnamon, garlic, turmeric, plant sterols or red yeast rice for the same period."  "What we found was that rosuvastatin lowered LDL cholesterol by almost 38% and that was vastly superior to placebo and any of the six supplements studied in the trial," study Despina Hidden, M.D. of the [facility] [place] told NPR. He says this level of reduction is enough to lower the risk of heart attacks and strokes. The findings are published in the Journal of the [employer] of Cardiology.  "Oftentimes these supplements are marketed as 'natural ways' to lower your cholesterol," says Laffin. But he says none of the dietary supplements demonstrated any significant decrease in LDL cholesterol compared with a placebo. LDL  cholesterol is considered the 'bad cholesterol' because it can contribute to plaque build-up in the artery walls - which can narrow the arteries, and set the stage for heart attacks and strokes"

## 2021-09-14 ENCOUNTER — Ambulatory Visit: Payer: Federal, State, Local not specified - PPO

## 2021-09-15 ENCOUNTER — Ambulatory Visit: Payer: Federal, State, Local not specified - PPO

## 2021-09-18 DIAGNOSIS — Z20822 Contact with and (suspected) exposure to covid-19: Secondary | ICD-10-CM | POA: Diagnosis not present

## 2021-09-18 DIAGNOSIS — H16143 Punctate keratitis, bilateral: Secondary | ICD-10-CM | POA: Diagnosis not present

## 2021-09-23 DIAGNOSIS — H16143 Punctate keratitis, bilateral: Secondary | ICD-10-CM | POA: Diagnosis not present

## 2021-10-15 ENCOUNTER — Other Ambulatory Visit: Payer: Self-pay

## 2021-10-15 ENCOUNTER — Ambulatory Visit (INDEPENDENT_AMBULATORY_CARE_PROVIDER_SITE_OTHER)
Admission: RE | Admit: 2021-10-15 | Discharge: 2021-10-15 | Disposition: A | Discharge status: Home / Self Care | Payer: Self-pay | Source: Ambulatory Visit | Attending: Cardiology | Admitting: Cardiology

## 2021-10-15 DIAGNOSIS — E782 Mixed hyperlipidemia: Secondary | ICD-10-CM

## 2021-10-19 ENCOUNTER — Telehealth (HOSPITAL_BASED_OUTPATIENT_CLINIC_OR_DEPARTMENT_OTHER): Payer: Self-pay

## 2021-10-19 DIAGNOSIS — E782 Mixed hyperlipidemia: Secondary | ICD-10-CM

## 2021-10-19 MED ORDER — ROSUVASTATIN CALCIUM 20 MG PO TABS: 20.0000 mg | ORAL_TABLET | Freq: Every day | ORAL | 3 refills | Status: DC

## 2021-10-19 MED ORDER — ASPIRIN EC 81 MG PO TBEC: 81.0000 mg | DELAYED_RELEASE_TABLET | Freq: Every day | ORAL | 3 refills | Status: AC

## 2021-10-19 NOTE — Telephone Encounter (Addendum)
Results called to patient who verbalizes understanding!  ? ? ?Patient endorses taking red yeast rice when starting Rosuvastatin. Patient is agreeable to starting rosuvastatin and baby aspirin. Labs mailed to patient.  ? ? ? ?----- Message from Buford Dresser, MD sent at 10/19/2021 12:59 PM EDT ----- ?Calcium score shows that there is calcium (and therefore plaque) in one of the vessels of the heart. It is not a super high number, but it does show that there is buildup.  ? ?Given the recent data that over the counter supplements do not prevent heart attack and stroke, I would recommend a statin medication based on your risk. We also discuss a baby aspirin (81 mg) daily when we see calcium in the vessels, if there is no blood in the stool/urine. I would recommend rosuvastatin 20 mg daily based on the prior blood work. Happy to discuss in a visit if that is preferred. ? ? ?Verbal- Lipids/LFT's in 2-3 MO  ?

## 2021-10-27 ENCOUNTER — Ambulatory Visit (INDEPENDENT_AMBULATORY_CARE_PROVIDER_SITE_OTHER): Payer: Medicare Other | Admitting: Family

## 2021-10-27 ENCOUNTER — Other Ambulatory Visit: Payer: Self-pay

## 2021-10-27 ENCOUNTER — Encounter (HOSPITAL_BASED_OUTPATIENT_CLINIC_OR_DEPARTMENT_OTHER): Payer: Self-pay | Admitting: Family

## 2021-10-27 VITALS — BP 118/60 | HR 75 | Ht <= 58 in | Wt 109.5 lb

## 2021-10-27 DIAGNOSIS — E785 Hyperlipidemia, unspecified: Secondary | ICD-10-CM

## 2021-10-27 DIAGNOSIS — I25118 Atherosclerotic heart disease of native coronary artery with other forms of angina pectoris: Secondary | ICD-10-CM

## 2021-10-27 DIAGNOSIS — I10 Essential (primary) hypertension: Secondary | ICD-10-CM | POA: Diagnosis not present

## 2021-10-27 NOTE — Patient Instructions (Addendum)
Medication Instructions:  ?Continue your current medications.  ? ?*If you need a refill on your cardiac medications before your next appointment, please call your pharmacy* ? ?Lab Work: ?Lipid panel as previously ordered in 2-3 months.  ? ?Please return for Lab work. You may come to the...  ? ?[facility] (3rd floor) ?[address], [place], [place] [address]  ?Open: 8am-Noon and 1pm-4:30pm  ? ?[facility] ?[address]  ? ?[employer]- Any location ? ?**no appointments needed**  ? ?Testing/Procedures: ?None ordered today.  ? ?Follow-Up: ?At [facility], you and your health needs are our priority.  As part of our continuing mission to provide you with exceptional heart care, we have created designated Provider Care Teams.  These Care Teams include your primary Cardiologist (physician) and Advanced Practice Providers (APPs -  Physician Assistants and Nurse Practitioners) who all work together to provide you with the care you need, when you need it. ? ?We recommend signing up for the patient portal called "MyChart".  Sign up information is provided on this After Visit Summary.  MyChart is used to connect with patients for Virtual Visits (Telemedicine).  Patients are able to view lab/test results, encounter notes, upcoming appointments, etc.  Non-urgent messages can be sent to your provider as well.   ?To learn more about what you can do with MyChart, go to [email or web address].   ? ?Your next appointment:   ?6 month(s) ? ?The format for your next appointment:   ?In Person ? ?Provider:   ?Jodelle Red, MD or Alver Sorrow, NP   ? ?Other Instructions ? ?Your cholesterol goals: ?Total cholesterol less than 200 ?HDL (good cholesterol) more than 40 ?LDL (bad cholesterol) less than 70 ?Triglycerides less than 150 ? ?For coronary artery disease often called "heart disease" we aim for optimal guideline directed medical therapy. We use the "A, B, C"s to  help keep [place] on track! ? ?A = Aspirin 81mg  daily ?B = Blood pressure control. Our goal is <130/80 ?C = Cholesterol control. You take Rosuvastatin to help control your cholesterol.  ?D = Diet - heart healthy!  ?E = Exercise - 150 minutes per week.  ? ?Heart Healthy Diet Recommendations: ?A low-salt diet is recommended. Meats should be grilled, baked, or boiled. Avoid fried foods. Focus on lean protein sources like fish or chicken with vegetables and fruits. The American Heart Association is a !  American Heart Association Diet and Lifeystyle Recommendations   ? ?Exercise recommendations: ?The American Heart Association recommends 150 minutes of moderate intensity exercise weekly. ?Try 30 minutes of moderate intensity exercise 4-5 times per week. ?This could include walking, jogging, or swimming.    ?

## 2021-10-27 NOTE — Progress Notes (Signed)
? ?Office Visit  ?  ?Patient Name: Paige Calhoun ?Date of Encounter: 10/27/2021 ? ?PCP:  Aretta Nip, MD ?  ?[place]  ?Cardiologist:  Buford Dresser, MD  ?Advanced Practice Provider:  No care team member to display ?Electrophysiologist:  None  ? ? ?Chief Complaint  ?  ?Paige Calhoun is a 71 y.o. female with a hx of anxiety, hypertension, palpitations, CAD presents today for follow-up after cardiac CTA ? ?Past Medical History  ?  ?Past Medical History:  ?Diagnosis Date  ? Hypertension   ? Inverted nipple x years  ? bilateral  ? Vertigo   ? ?Past Surgical History:  ?Procedure Laterality Date  ? ABDOMINAL HYSTERECTOMY    ? APPENDECTOMY    ? MENISCUS REPAIR Left   ? ? ?Allergies ? ?No Known Allergies ? ?History of Present Illness  ?  ?Paige Calhoun is a 71 y.o. female with a hx of anxiety, hypertension, hyperlipidemia, coronary artery disease last seen 08/2021 by Dr. Harrell Gave. ? ?Family history notable for grandmother passing away from MI while walking to her bathroom, mother with CHF, maternal aunt with heart issues, cousin born with a hole in her heart.  Her father had liver failure.  Sister with diabetes and brain aneurysm. ? ?Previous palpitations improved with nighttime lorazepam.  Prior monitor 07/2018 with predominantly NSR with occasional PVC (1.8%).  And echo 07/2018 with LVEF 55 to [id number], grade 1 diastolic dysfunction, no significant valvular abnormalities. ? ?She was last seen 08/2021 by Dr. Harrell Gave.  Due to exertional dyspnea coronary artery calcium score was ordered.  Coronary calcium score 166 placing her in 77th percentile for age, race, sex matched control (LAD 30).  ? ?She presents today for follow-up. She enjoys spending her spare time watching dogs for friends and is excited for upcoming trip to [facility] [place] weekend with her son, daughter-in-law, granddaughter. Reviewed coronary calcium score in detail. Notes that her dyspnea is only with  more than usual activity.  She is watching a dog on the third floor taking 32 stairs up and down to walk the dog without dyspnea.  Reports no chest pain, pressure, or tightness. No edema, orthopnea, PND. Reports no palpitations.   ? ?EKGs/Labs/Other Studies Reviewed:  ? ?The following studies were reviewed today: ? ?Coronary calcium score 09/2021 ?FINDINGS: ?Coronary arteries: Normal origins. ?  ?Coronary Calcium Score: ?  ?Left main: 0 ?  ?Left anterior descending artery: 166 ?  ?Left circumflex artery: 0 ?  ?Right coronary artery: 0 ?  ?Total: 166 ?  ?Percentile: 77 ?  ?Pericardium: Normal. ?  ?Ascending Aorta: Normal caliber. ?  ?Non-cardiac: See separate report from [facility]. ?  ?IMPRESSION: ?Coronary calcium score of 166. This was 83 percentile for age-, ?race-, and sex-matched controls. ? ?EKG: No EKG today ? ?Recent Labs: ?No results found for requested labs within last 8760 hours.  ?Recent Lipid Panel ?No results found for: CHOL, TRIG, HDL, CHOLHDL, VLDL, LDLCALC, LDLDIRECT ? ? ?Home Medications  ? ?Current Meds  ?Medication Sig  ? alendronate (FOSAMAX) 70 MG tablet Take 70 mg by mouth once a week.  ? aspirin EC 81 MG tablet Take 1 tablet (81 mg total) by mouth daily. Swallow whole.  ? LORazepam (ATIVAN) 1 MG tablet Take 0.5 tablets (0.5 mg total) by mouth as needed for anxiety.  ? Multiple Vitamins-Minerals (MULTIVITAMIN & MINERAL PO) Take 1 tablet by mouth daily.  ? olmesartan-hydrochlorothiazide (BENICAR HCT) 20-12.5 MG tablet Take 1 tablet by  mouth daily.  ? rosuvastatin (CRESTOR) 20 MG tablet Take 1 tablet (20 mg total) by mouth daily.  ?  ? ?Review of Systems  ?    ?All other systems reviewed and are otherwise negative except as noted above. ? ?Physical Exam  ?  ?VS:  BP 118/60 (BP Location: Right Arm, Patient Position: Sitting, Cuff Size: Normal)   Pulse 75   Ht 4\' 10"  (1.473 m)   Wt 109 lb 8 oz (49.7 kg)   SpO2 98%   BMI 22.89 kg/m?  , BMI Body mass index is 22.89 kg/m?. ? ?Wt  Readings from Last 3 Encounters:  ?10/27/21 109 lb 8 oz (49.7 kg)  ?09/01/21 110 lb 4.8 oz (50 kg)  ?09/11/19 111 lb 6.4 oz (50.5 kg)  ?  ? ?GEN: Well nourished, well developed, in no acute distress. ?HEENT: normal. ?Neck: Supple, no JVD, carotid bruits, or masses. ?Cardiac: RRR, no murmurs, rubs, or gallops. No clubbing, cyanosis, edema.  Radials/PT 2+ and equal bilaterally.  ?Respiratory:  Respirations regular and unlabored, clear to auscultation bilaterally. ?GI: Soft, nontender, nondistended. ?MS: No deformity or atrophy. ?Skin: Warm and dry, no rash. ?Neuro:  Strength and sensation are intact. ?Psych: Normal affect. ? ?Assessment & Plan  ?  ?CAD - Stable with no anginal symptoms. No indication for ischemic evaluation.  GDMT includes Aspirin, Rosuvastatin. Heart healthy diet and regular cardiovascular exercise encouraged.  Extensive teaching on secondary prevention discussed today. She is considering PREP exercise program [facility] the [facility] but may wait for the fall and her dog sitting schedule/busy.  If she wishes for referral she will contact our office. ? ?HLD, LDL goal less than 70 - Picking up Rosuvastatin 20mg  QD from pharmacy today. Already has lipids ordered for 2-3 months. Discussed lipid lowering diet which she is already adhering to. If LDL not at goal of <70 in 2-3 months consider increased dose vs Zetia vs PCSK9i. ? ?HTN - BP well controlled. Continue current antihypertensive regimen.   ? ?  ? ?Disposition: Follow up in 6 month(s) with Buford Dresser, MD or APP. ? ?Signed, ?Loel Dubonnet, NP ?10/27/2021, 10:00 AM ?[place] ?

## 2021-11-03 ENCOUNTER — Telehealth: Payer: Self-pay | Admitting: Family

## 2021-11-03 NOTE — Telephone Encounter (Signed)
Pt c/o medication issue: ? ?1. Name of Medication: rosuvastatin (CRESTOR) 20 MG tablet ? ?2. How are you currently taking this medication (dosage and times per day)? 1 tablet daily ? ?3. Are you having a reaction (difficulty breathing--STAT)? no ? ?4. What is your medication issue? Patient states today would be the 8th day taking the medication. She says she read the side effects and it mentioned to call if she noticed any foaming urine. She says she has noticed some bubbling in her urine and has never had it before. She says it is not substantial, but would like to know if this is concerning.  ?

## 2021-11-03 NOTE — Telephone Encounter (Signed)
RN returned call and updated patient with NP recommendations.  ? ? ? ? ?Not of concern. Proceed with plan for labs in 2 months. These will let us reassess cholesterol numbers and liver function. Only small or occasional foaming/bubbles not of concern or suggestive of proteinuria.  ?  ?Alver Sorrow, NP  ?

## 2021-11-03 NOTE — Telephone Encounter (Signed)
-  Pt state she read the side effects of rosuvastatin and state it indicated to contact your physician if you notice bubbles in your urine. ?-Pt state she is currently on day 8 and recently noticed a small amount of bubbles in her urine. ?-She report no changes in color/smell and state she drinks sufficient amount of fluid. ?-Pt is questioning if this is something she should be concerned about. ? ?Will forward to NP  ?

## 2021-11-03 NOTE — Telephone Encounter (Signed)
Not of concern. Proceed with plan for labs in 2 months. These will let us reassess cholesterol numbers and liver function. Only small or occasional foaming/bubbles not of concern or suggestive of proteinuria.  ? ?Alver Sorrow, NP  ?

## 2021-11-23 ENCOUNTER — Other Ambulatory Visit: Payer: Self-pay | Admitting: Family Medicine

## 2021-11-23 DIAGNOSIS — M810 Age-related osteoporosis without current pathological fracture: Secondary | ICD-10-CM

## 2021-11-25 DIAGNOSIS — M533 Sacrococcygeal disorders, not elsewhere classified: Secondary | ICD-10-CM | POA: Diagnosis not present

## 2021-12-17 ENCOUNTER — Telehealth: Payer: Self-pay | Admitting: Cardiology

## 2021-12-17 NOTE — Telephone Encounter (Signed)
Pt c/o medication issue:  1. Name of Medication: rosuvastatin (CRESTOR) 20 MG tablet  2. How are you currently taking this medication (dosage and times per day)? 1 tablet daily  3. Are you having a reaction (difficulty breathing--STAT)? no  4. What is your medication issue? Patient states she started the medication 3/20 and she is having a lot of muscle pain and tiredness in the last 2 weeks.

## 2021-12-17 NOTE — Telephone Encounter (Signed)
Would you like me to switch her to another medication?

## 2021-12-21 MED ORDER — ROSUVASTATIN CALCIUM 10 MG PO TABS: 10.0000 mg | ORAL_TABLET | Freq: Every day | ORAL | 3 refills | Status: DC

## 2021-12-21 NOTE — Telephone Encounter (Signed)
Would confirm that pt stopped red yeast rice, she should not be taking this in combination with her rosuvastatin. If she is still taking red yeast rice, she should stop it and continue rosuvastatin and monitor for symptom improvement. If she has stopped red yeast rice, recommend stopping rosuvastatin for 1 week to see if symptoms resolve and if so, would restart at lower dose of 10mg  daily to see if she tolerates this better.

## 2021-12-21 NOTE — Telephone Encounter (Signed)
RN returned call to patient and advised her of the following recommendations. Prescriptions updated and sent to pharmacy.     "Would confirm that pt stopped red yeast rice, she should not be taking this in combination with her rosuvastatin. If she is still taking red yeast rice, she should stop it and continue rosuvastatin and monitor for symptom improvement. If she has stopped red yeast rice, recommend stopping rosuvastatin for 1 week to see if symptoms resolve and if so, would restart at lower dose of 10mg  daily to see if she tolerates this better."

## 2021-12-21 NOTE — Telephone Encounter (Signed)
Dr. Cristal Deer out of office, please advise for medication change.

## 2021-12-21 NOTE — Telephone Encounter (Signed)
Pt c/o medication issue:  1. Name of Medication:  rosuvastatin (CRESTOR) 20 MG tablet  2. How are you currently taking this medication (dosage and times per day)?   3. Are you having a reaction (difficulty breathing--STAT)?   4. What is your medication issue?   Patient is following up. States she has also been very tired. The muscle pain is mainly in her right arm--very sore. Has been taking ibuprofen and uses a heating pad on that area. Please advise ASAP.

## 2021-12-22 DIAGNOSIS — E782 Mixed hyperlipidemia: Secondary | ICD-10-CM | POA: Diagnosis not present

## 2021-12-23 LAB — LIPID PANEL
Chol/HDL Ratio: 2.1 ratio (ref 0.0–4.4)
Cholesterol, Total: 143 mg/dL (ref 100–199)
HDL: 67 mg/dL (ref >39)
LDL Chol Calc (NIH): 63 mg/dL (ref 0–99)
Triglycerides: 63 mg/dL (ref 0–149)
VLDL Cholesterol Cal: 13 mg/dL (ref 5–40)

## 2021-12-23 LAB — HEPATIC FUNCTION PANEL
ALT: 16 IU/L (ref 0–32)
AST: 23 IU/L (ref 0–40)
Albumin: 4.5 g/dL (ref 3.7–4.7)
Alkaline Phosphatase: 73 IU/L (ref 44–121)
Bilirubin Total: 0.3 mg/dL (ref 0.0–1.2)
Bilirubin, Direct: 0.11 mg/dL (ref 0.00–0.40)
Total Protein: 6.9 g/dL (ref 6.0–8.5)

## 2021-12-25 ENCOUNTER — Encounter (HOSPITAL_BASED_OUTPATIENT_CLINIC_OR_DEPARTMENT_OTHER): Payer: Self-pay

## 2021-12-25 NOTE — Telephone Encounter (Signed)
Pt called in returning Serena Croissant call  Best number is [phone]

## 2021-12-25 NOTE — Telephone Encounter (Signed)
Pt made aware and lab results and verbalized understanding.

## 2021-12-25 NOTE — Telephone Encounter (Signed)
Hey this patient is returning your call from earlier today

## 2022-01-01 ENCOUNTER — Telehealth (HOSPITAL_BASED_OUTPATIENT_CLINIC_OR_DEPARTMENT_OTHER): Payer: Self-pay | Admitting: Cardiology

## 2022-01-01 NOTE — Telephone Encounter (Signed)
Follow Up:     Patient said she was supposed have received a copy of her lab work in the mail. She says she can pick it up today instead of mailing it.

## 2022-01-01 NOTE — Telephone Encounter (Signed)
Rn clarified with patient that her lab results have been mailed to her on 5/26 by Gregor Hams, RN. Patient verbalizes understanding that they just have not reached her yet.

## 2022-01-05 DIAGNOSIS — M25511 Pain in right shoulder: Secondary | ICD-10-CM | POA: Diagnosis not present

## 2022-01-12 ENCOUNTER — Other Ambulatory Visit: Payer: Self-pay | Admitting: Family Medicine

## 2022-01-12 DIAGNOSIS — M810 Age-related osteoporosis without current pathological fracture: Secondary | ICD-10-CM

## 2022-03-15 DIAGNOSIS — M25511 Pain in right shoulder: Secondary | ICD-10-CM | POA: Diagnosis not present

## 2022-04-19 ENCOUNTER — Encounter (HOSPITAL_BASED_OUTPATIENT_CLINIC_OR_DEPARTMENT_OTHER): Payer: Self-pay | Admitting: Cardiology

## 2022-04-19 ENCOUNTER — Ambulatory Visit (INDEPENDENT_AMBULATORY_CARE_PROVIDER_SITE_OTHER): Payer: Medicare Other | Admitting: Cardiology

## 2022-04-19 VITALS — BP 110/62 | HR 79 | Ht 59.0 in | Wt 105.4 lb

## 2022-04-19 DIAGNOSIS — E785 Hyperlipidemia, unspecified: Secondary | ICD-10-CM | POA: Diagnosis not present

## 2022-04-19 DIAGNOSIS — R002 Palpitations: Secondary | ICD-10-CM

## 2022-04-19 DIAGNOSIS — Z7189 Other specified counseling: Secondary | ICD-10-CM

## 2022-04-19 DIAGNOSIS — I2510 Atherosclerotic heart disease of native coronary artery without angina pectoris: Secondary | ICD-10-CM

## 2022-04-19 DIAGNOSIS — I10 Essential (primary) hypertension: Secondary | ICD-10-CM

## 2022-04-19 NOTE — Progress Notes (Signed)
Cardiology Office Note:    Date:  04/19/2022   ID:  Paige Calhoun, DOB [birth year], MRN [id number]  PCP:  Aretta Nip, MD  Cardiologist:  Buford Dresser, MD PhD  Referring MD: Aretta Nip, MD   CC: follow up  History of Present Illness:    Paige Calhoun is a 71 y.o. female with a hx of anxiety, hypertension who is seen for follow up today. I initially saw her 07/12/2018 as a new consult at the request of Rankins, Bill Salinas, MD for the evaluation and management of palpitations, fatigue, and shortness of breath.  She was last seen 10/27/2021 by Laurann Montana, NP for follow up. Her coronary calcium score was reviewed in detail. She noted that her dyspnea would occur when she engaged in more activity than normal. She was able to walk 32 stairs up and down without dyspnea.   Family history: grandmother passed away from MI while walking to her bathroom, so she is concerned about this. Mother passed away at age 50 from CHF, maternal aunt has heart issues, cousin born with a hole in her heart. Father had liver failure, sister has diabetes and brain aneurysm.  Today:  She states she has been feeling really well. She has been working hard this summer with dog sitting and house cleaning, and it has been going well. She recently had a visit from her sister where she was able to relax. She often has to walk 40 steps at a time, several times a day. She feels somewhat short of breath with this but can complete it without issues.  She has still been experiencing occasional night palpitations. She states she does not feel these during the day.  Of note, she states that sometimes when she is eating, food is getting stuck. She does not eat quickly or large portions. The food always ends up going down. No regurgitation.  She denies any chest pain, shortness of breath, or peripheral edema. No lightheadedness, headaches, syncope, orthopnea, or PND.  Past Medical History:  Diagnosis  Date   Hypertension    Inverted nipple x years   bilateral   Vertigo     Past Surgical History:  Procedure Laterality Date   ABDOMINAL HYSTERECTOMY     APPENDECTOMY     MENISCUS REPAIR Left     Current Medications: Current Outpatient Medications on File Prior to Visit  Medication Sig   alendronate (FOSAMAX) 70 MG tablet Take 70 mg by mouth once a week.   ascorbic acid (VITAMIN C) 500 MG tablet    aspirin EC 81 MG tablet Take 1 tablet (81 mg total) by mouth daily. Swallow whole.   Calcium Carb-Cholecalciferol 600-12.5 MG-MCG CAPS Take by oral route.   LORazepam (ATIVAN) 1 MG tablet Take 0.5 tablets (0.5 mg total) by mouth as needed for anxiety.   Multiple Vitamins-Minerals (MULTIVITAMIN & MINERAL PO) Take 1 tablet by mouth daily.   olmesartan-hydrochlorothiazide (BENICAR HCT) 20-12.5 MG tablet Take 1 tablet by mouth daily.   rosuvastatin (CRESTOR) 10 MG tablet Take 1 tablet (10 mg total) by mouth daily.   vitamin E 1000 UNIT capsule Take 1,000 Units by mouth daily.   No current facility-administered medications on file prior to visit.     Allergies:   Doxycycline and Sertraline   Social History   Tobacco Use   Smoking status: Never   Smokeless tobacco: Never  Substance Use Topics   Drug use: Never    Family History: grandmother passed away from MI while  walking to her bathroom, so she is concerned about this. Mother passed away at age 61 from CHF, maternal aunt has heart issues, cousin born with a hole in her heart. Father had liver failure, sister has diabetes and brain aneurysm. Cousin had MI at 44.   ROS:   Please see the history of present illness.   (+) Palpitations   Additional pertinent ROS otherwise unremarkable.   EKGs/Labs/Other Studies Reviewed:    The following studies were reviewed today:  CT Cardiac Score 10/15/2021: FINDINGS: Coronary arteries: Normal origins.   Coronary Calcium Score:   Left main: 0   Left anterior descending artery: 166    Left circumflex artery: 0   Right coronary artery: 0   Total: 166   Percentile: 77   Pericardium: Normal.   Ascending Aorta: Normal caliber.   Non-cardiac: See separate report from [facility].   IMPRESSION: Coronary calcium score of 166. This was 68 percentile for age-, race-, and sex-matched controls.   Echo 07/20/18: Left ventricle: The cavity size was normal. Wall thickness was    normal. Systolic function was normal. The estimated ejection    fraction was in the range of 55% to 60%. Wall motion was normal;    there were no regional wall motion abnormalities. Doppler    parameters are consistent with abnormal left ventricular    relaxation (grade 1 diastolic dysfunction).  - Mitral valve: Calcified annulus.   Monitor 08/07/18: 7 days of data recorded on Zio monitor. No VT, atrial fibrillation, high degree block, or pauses noted. Isolated atrial ectopy was rare (<1%). PVCs were occasional (1.8%), with rare couplets and bigeminy/trigeminy. There were 4 patient triggered events, which were sinus rhythm with PAC or PVC. Patient had a min HR of 56 bpm, max HR of 162 bpm, and avg HR of 88 bpm. Predominant underlying rhythm was Sinus Rhythm. 1 run of Supraventricular Tachycardia occurred lasting 12 beats with a max rate of 113 bpm (avg 91 bpm).   EKG:  EKG is personally reviewed.   04/19/22: not ordered today 09/01/21: NSR at 83 bpm  Recent Labs: 12/22/2021: ALT 16  Recent Lipid Panel    Component Value Date/Time   CHOL 143 12/22/2021 0835   TRIG 63 12/22/2021 0835   HDL 67 12/22/2021 0835   CHOLHDL 2.1 12/22/2021 0835   LDLCALC 63 12/22/2021 0835    Physical Exam:    VS:  BP 110/62   Pulse 79   Ht 4\' 11"  (1.499 m)   Wt 105 lb 6.4 oz (47.8 kg)   SpO2 98%   BMI 21.29 kg/m     Wt Readings from Last 3 Encounters:  04/19/22 105 lb 6.4 oz (47.8 kg)  10/27/21 109 lb 8 oz (49.7 kg)  09/01/21 110 lb 4.8 oz (50 kg)    GEN: Well nourished, well developed in no  acute distress HEENT: Normal, moist mucous membranes NECK: No JVD CARDIAC: regular rhythm, normal S1 and S2, no rubs or gallops. 1/6 systolic murmur. VASCULAR: Radial and DP pulses 2+ bilaterally. No carotid bruits RESPIRATORY:  Clear to auscultation without rales, wheezing or rhonchi  ABDOMEN: Soft, non-tender, non-distended MUSCULOSKELETAL:  Ambulates independently SKIN: Warm and dry, no edema NEUROLOGIC:  Alert and oriented x 3. No focal neuro deficits noted. PSYCHIATRIC:  Normal affect    ASSESSMENT:    1. Nonocclusive coronary atherosclerosis of native coronary artery   2. Hyperlipidemia LDL goal <70   3. Essential hypertension   4. Counseling on health promotion and  disease prevention   5. Heart palpitations    PLAN:    Palpitations: mostly at night, nonlimiting -monitor with rare (<2%) PVCs -echo without structural abnormalities  Hypertension: at goal -continue olmesartan-HCTZ  Hypercholesterolemia/mixed hyperlipidemia Nonobstructive CAD based on calcium score -last lipids 12/22/21: Tchol 143, HDL 67, LDL 63, TG 63 -continue aspirin, rosuvastatin  Primary prevention and health counseling: with family history of heart disease -recommend heart healthy/Mediterranean diet, with whole grains, fruits, vegetable, fish, lean meats, nuts, and olive oil. Limit salt. -recommend moderate walking, 3-5 times/week for 30-50 minutes each session. Aim for at least 150 minutes.week. Goal should be pace of 3 miles/hours, or walking 1.5 miles in 30 minutes -recommend avoidance of tobacco products. Avoid excess alcohol.  Plan for follow up: every 2 years or sooner as needed.   Jodelle Red, MD, PhD [place]  [facility]   Medication Adjustments/Labs and Tests Ordered: Current medicines are reviewed at length with the patient today.  Concerns regarding medicines are outlined above.  No orders of the defined types were placed in this encounter.  No orders of the defined  types were placed in this encounter.   Patient Instructions  Medication Instructions:  Your Physician recommend you continue on your current medication as directed.    *If you need a refill on your cardiac medications before your next appointment, please call your pharmacy*   Lab Work: None ordered today   Testing/Procedures: None ordered today   Follow-Up: At [facility], you and your health needs are our priority.  As part of our continuing mission to provide you with exceptional heart care, we have created designated Provider Care Teams.  These Care Teams include your primary Cardiologist (physician) and Advanced Practice Providers (APPs -  Physician Assistants and Nurse Practitioners) who all work together to provide you with the care you need, when you need it.  We recommend signing up for the patient portal called "MyChart".  Sign up information is provided on this After Visit Summary.  MyChart is used to connect with patients for Virtual Visits (Telemedicine).  Patients are able to view lab/test results, encounter notes, upcoming appointments, etc.  Non-urgent messages can be sent to your provider as well.   To learn more about what you can do with MyChart, go to [email or web address].    Your next appointment:   2 year(s)  The format for your next appointment:   In Person  Provider:   Jodelle Red, MD           I,Breanna Adamick,acting as a scribe for Jodelle Red, MD.,have documented all relevant documentation on the behalf of Jodelle Red, MD,as directed by  Jodelle Red, MD while in the presence of Jodelle Red, MD.  I, Jodelle Red, MD, have reviewed all documentation for this visit. The documentation on 04/19/22 for the exam, diagnosis, procedures, and orders are all accurate and complete.   Signed, Jodelle Red, MD PhD 04/19/2022    [facility]

## 2022-04-19 NOTE — Patient Instructions (Signed)
Medication Instructions:  Your Physician recommend you continue on your current medication as directed.    *If you need a refill on your cardiac medications before your next appointment, please call your pharmacy*   Lab Work: None ordered today   Testing/Procedures: None ordered today   Follow-Up: At [facility], you and your health needs are our priority.  As part of our continuing mission to provide you with exceptional heart care, we have created designated Provider Care Teams.  These Care Teams include your primary Cardiologist (physician) and Advanced Practice Providers (APPs -  Physician Assistants and Nurse Practitioners) who all work together to provide you with the care you need, when you need it.  We recommend signing up for the patient portal called "MyChart".  Sign up information is provided on this After Visit Summary.  MyChart is used to connect with patients for Virtual Visits (Telemedicine).  Patients are able to view lab/test results, encounter notes, upcoming appointments, etc.  Non-urgent messages can be sent to your provider as well.   To learn more about what you can do with MyChart, go to [email or web address].    Your next appointment:   2 year(s)  The format for your next appointment:   In Person  Provider:   Bridgette Christopher, MD          

## 2022-04-21 DIAGNOSIS — M533 Sacrococcygeal disorders, not elsewhere classified: Secondary | ICD-10-CM | POA: Diagnosis not present

## 2022-05-06 DIAGNOSIS — S63592A Other specified sprain of left wrist, initial encounter: Secondary | ICD-10-CM | POA: Diagnosis not present

## 2022-05-06 DIAGNOSIS — M25532 Pain in left wrist: Secondary | ICD-10-CM | POA: Diagnosis not present

## 2022-05-12 ENCOUNTER — Other Ambulatory Visit: Payer: Self-pay | Admitting: Family Medicine

## 2022-05-12 ENCOUNTER — Ambulatory Visit
Admission: RE | Admit: 2022-05-12 | Discharge: 2022-05-12 | Disposition: A | Discharge status: Home / Self Care | Payer: Medicare Other | Source: Ambulatory Visit | Attending: Family Medicine | Admitting: Family Medicine

## 2022-05-12 DIAGNOSIS — Z1231 Encounter for screening mammogram for malignant neoplasm of breast: Secondary | ICD-10-CM

## 2022-05-12 DIAGNOSIS — M810 Age-related osteoporosis without current pathological fracture: Secondary | ICD-10-CM

## 2022-05-13 ENCOUNTER — Other Ambulatory Visit: Payer: Federal, State, Local not specified - PPO

## 2022-05-13 ENCOUNTER — Ambulatory Visit: Payer: Federal, State, Local not specified - PPO

## 2022-05-14 ENCOUNTER — Ambulatory Visit
Admission: RE | Admit: 2022-05-14 | Discharge: 2022-05-14 | Disposition: A | Discharge status: Home / Self Care | Payer: Medicare Other | Source: Ambulatory Visit | Attending: Family Medicine | Admitting: Family Medicine

## 2022-05-14 DIAGNOSIS — M810 Age-related osteoporosis without current pathological fracture: Secondary | ICD-10-CM

## 2022-06-15 DIAGNOSIS — I10 Essential (primary) hypertension: Secondary | ICD-10-CM | POA: Diagnosis not present

## 2022-06-15 DIAGNOSIS — G4700 Insomnia, unspecified: Secondary | ICD-10-CM | POA: Diagnosis not present

## 2022-06-15 DIAGNOSIS — Z6820 Body mass index (BMI) 20.0-20.9, adult: Secondary | ICD-10-CM | POA: Diagnosis not present

## 2022-06-15 DIAGNOSIS — M810 Age-related osteoporosis without current pathological fracture: Secondary | ICD-10-CM | POA: Diagnosis not present

## 2022-06-15 DIAGNOSIS — E785 Hyperlipidemia, unspecified: Secondary | ICD-10-CM | POA: Diagnosis not present

## 2022-06-15 DIAGNOSIS — Z23 Encounter for immunization: Secondary | ICD-10-CM | POA: Diagnosis not present

## 2022-06-15 DIAGNOSIS — Z0000 Encounter for general adult medical examination without abnormal findings: Secondary | ICD-10-CM | POA: Diagnosis not present

## 2022-06-15 DIAGNOSIS — L989 Disorder of the skin and subcutaneous tissue, unspecified: Secondary | ICD-10-CM | POA: Diagnosis not present

## 2022-10-11 DIAGNOSIS — M25511 Pain in right shoulder: Secondary | ICD-10-CM | POA: Diagnosis not present

## 2022-10-12 DIAGNOSIS — M5459 Other low back pain: Secondary | ICD-10-CM | POA: Diagnosis not present

## 2022-10-19 DIAGNOSIS — M2242 Chondromalacia patellae, left knee: Secondary | ICD-10-CM | POA: Diagnosis not present

## 2022-11-18 IMAGING — CT CT CARDIAC CORONARY ARTERY CALCIUM SCORE
3 series · 14 of 20 positions shown, 16 images · non-contrast
Comparison: None.
COMPARISON: None.

Addendum:
EXAM:
OVER-READ INTERPRETATION  CT CHEST

The following report is an over-read performed by radiologist Dr.
Kaoru Aburaya [REDACTED] on 10/15/2021. This over-read
does not include interpretation of cardiac or coronary anatomy or
pathology. The coronary calcium score interpretation by the
cardiologist is attached.
TECHNIQUE: A gated, non-contrast computed tomography scan of the heart was
performed using 3mm slice thickness. Axial images were analyzed on a
dedicated workstation. Calcium scoring of the coronary arteries was
performed using the Agatston method.

[Series 2: cascseq 2.0 sa36 70% (id) · axial · 0.39mm/px · z∈[-197,-117]mm · 4 of 68 slices shown]
[im 14/68  vessel]
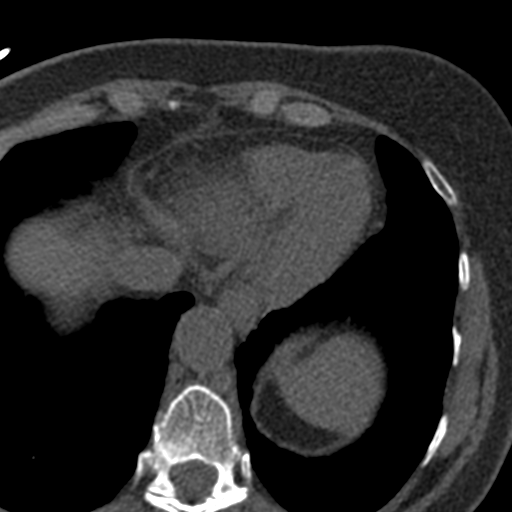
[im 27/68  vessel]
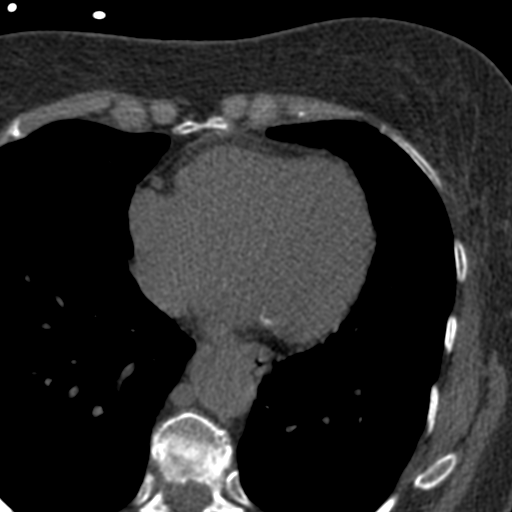
[im 41/68  vessel]
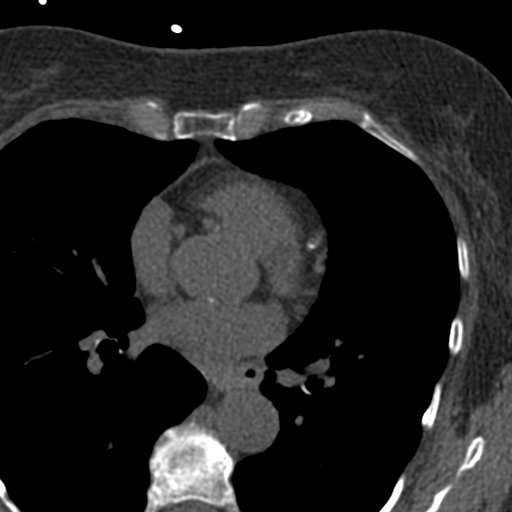
[im 54/68  vessel]
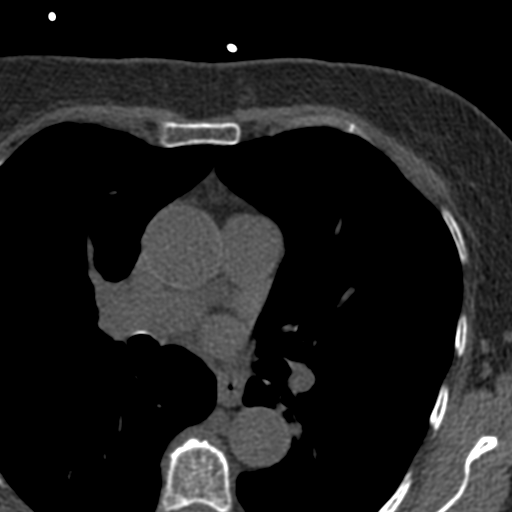

[Series 3: cascseq 2.0 bf37 st · axial · 0.57mm/px · z∈[-201,-113]mm · 5 of 68 slices shown, 7 images]
[im 12/68  vessel]
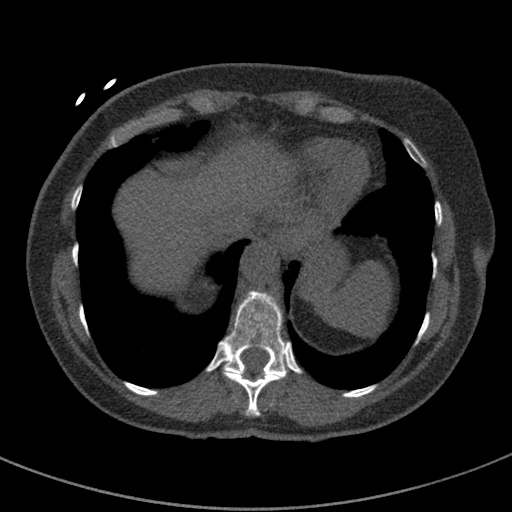
[im 12/68  lung]
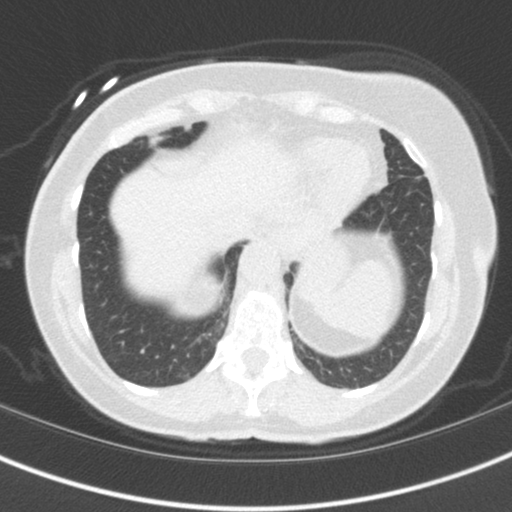
[im 23/68  vessel]
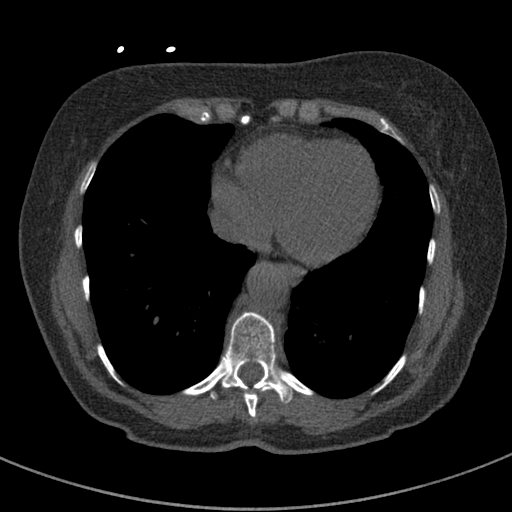
[im 34/68  vessel]
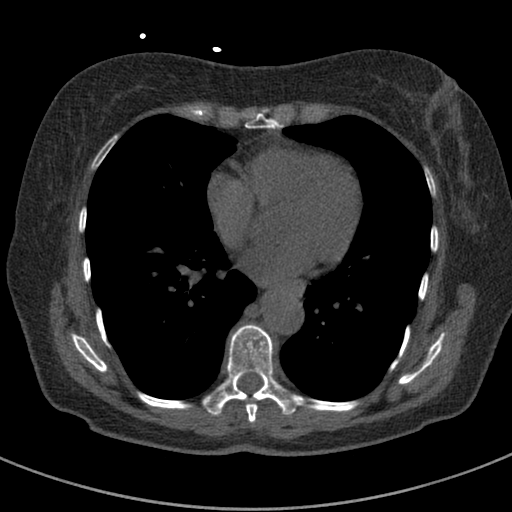
[im 45/68  vessel]
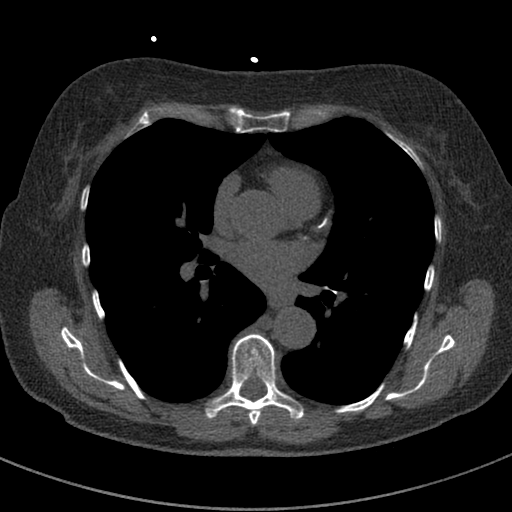
[im 56/68  vessel]
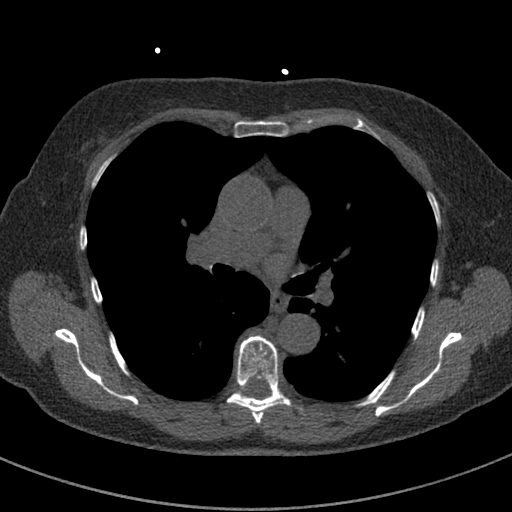
[im 56/68  lung]
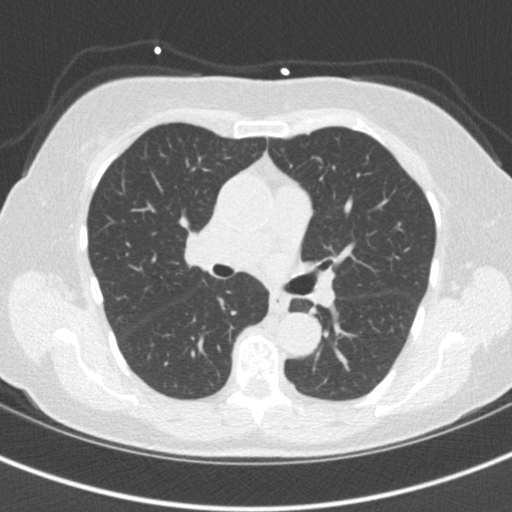

[Series 4: cascseq 2.0 br59 lung · axial · 0.57mm/px · z∈[-201,-113]mm · 5 of 68 slices shown]
[im 12/68  lung]
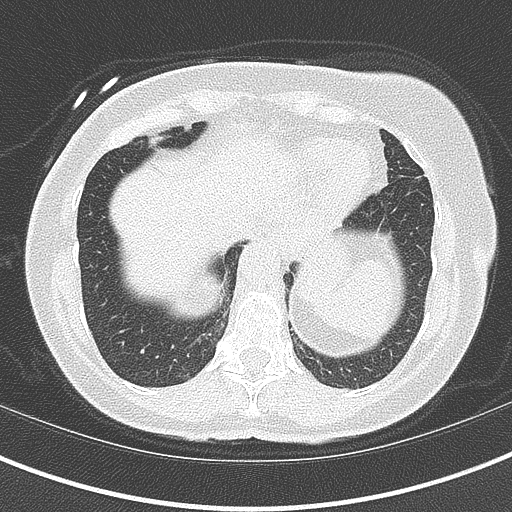
[im 23/68  lung]
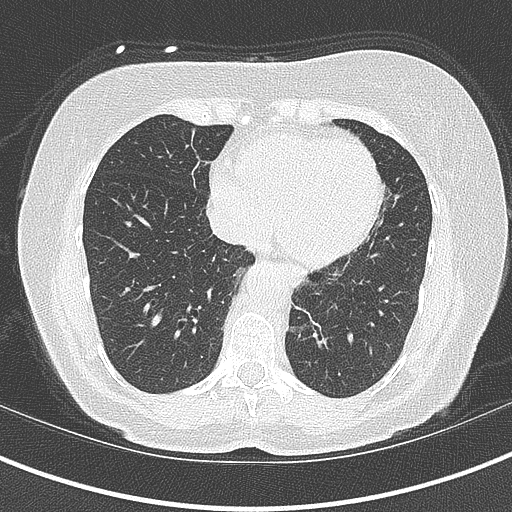
[im 34/68  lung]
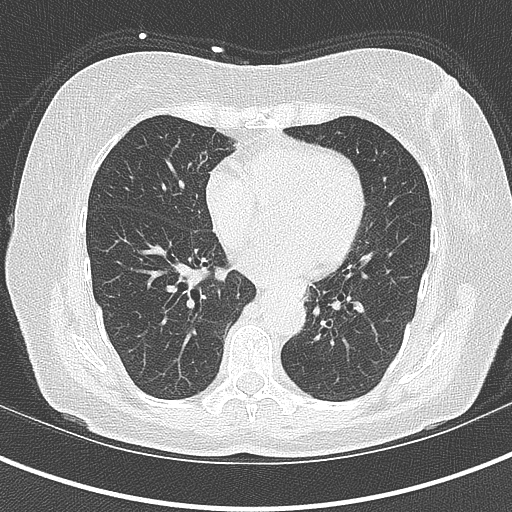
[im 45/68  lung]
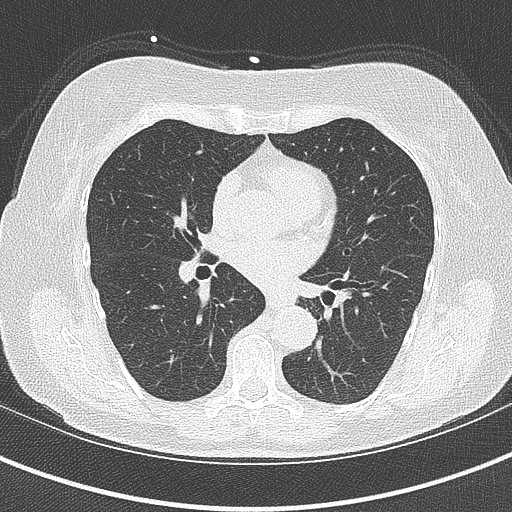
[im 56/68  lung]
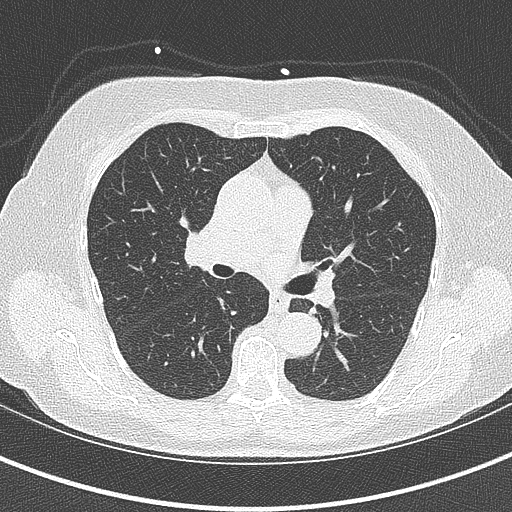

[14 of 20 positions shown; findings below may reference images not displayed]

FINDINGS: Vascular: Aortic atherosclerotic calcifications.

Mediastinum/Nodes: Visualized mediastinal structures are normal.

Lungs/Pleura: Areas of bronchiectasis. No large areas of
consolidation. Few streaky densities at the medial left lung base
are suggestive for scarring or atelectasis.

Upper Abdomen: Images of the upper abdomen are unremarkable.

Musculoskeletal: No acute bone abnormality.
IMPRESSION: 1. No acute extracardiac findings.
2. Areas of bronchiectasis in the visualized lungs. Findings could
be related to prior inflammation.

ADDENDUM:
Cardiovascular Disease Risk stratification

EXAM:
Coronary Calcium Score
FINDINGS: Coronary arteries: Normal origins.

Coronary Calcium Score:

Left main: 0

Left anterior descending artery: 166

Left circumflex artery: 0

Right coronary artery: 0

Total: 166

Percentile: 77

Pericardium: Normal.

Ascending Aorta: Normal caliber.

Non-cardiac: See separate report from [REDACTED].
IMPRESSION: Coronary calcium score of 166. This was 77 percentile for age-,
race-, and sex-matched controls.



If CAC=0, it is reasonable to withhold statin therapy and reassess
in 5 to 10 years, as long as higher risk conditions are absent
(diabetes mellitus, family history of premature CHD in first degree
relatives (males <55 years; females <65 years), cigarette smoking,
or LDL >=190 mg/dL).

If CAC is 1 to 99, it is reasonable to initiate statin therapy for
patients >=55 years of age.

If CAC is >=100 or >=75th percentile, it is reasonable to initiate
statin therapy at any age.

Cardiology referral should be considered for patients with CAC
scores >=400 or >=75th percentile.

*1687 AHA/ACC/AACVPR/AAPA/ABC/ABIE/ERXLEBEN/OSFALDAS/Jim/SULTANA/HINCO/BAMBUCAFE
Guideline on the Management of Blood Cholesterol: A Report of the
American College of Cardiology/American Heart Association Task Force
on Clinical Practice Guidelines. J Am Coll Cardiol.
0684;73(24):3794-3372.

Dios Childs, DO

*** End of Addendum ***
EXAM:
OVER-READ INTERPRETATION  CT CHEST

The following report is an over-read performed by radiologist Dr.
Kaoru Aburaya [REDACTED] on 10/15/2021. This over-read
does not include interpretation of cardiac or coronary anatomy or
pathology. The coronary calcium score interpretation by the
cardiologist is attached.
FINDINGS: Vascular: Aortic atherosclerotic calcifications.

Mediastinum/Nodes: Visualized mediastinal structures are normal.

Lungs/Pleura: Areas of bronchiectasis. No large areas of
consolidation. Few streaky densities at the medial left lung base
are suggestive for scarring or atelectasis.

Upper Abdomen: Images of the upper abdomen are unremarkable.

Musculoskeletal: No acute bone abnormality.
IMPRESSION: 1. No acute extracardiac findings.
2. Areas of bronchiectasis in the visualized lungs. Findings could
be related to prior inflammation.

## 2022-12-14 DIAGNOSIS — G4700 Insomnia, unspecified: Secondary | ICD-10-CM | POA: Diagnosis not present

## 2022-12-14 DIAGNOSIS — D75839 Thrombocytosis, unspecified: Secondary | ICD-10-CM | POA: Diagnosis not present

## 2022-12-14 DIAGNOSIS — E785 Hyperlipidemia, unspecified: Secondary | ICD-10-CM | POA: Diagnosis not present

## 2022-12-14 DIAGNOSIS — I10 Essential (primary) hypertension: Secondary | ICD-10-CM | POA: Diagnosis not present

## 2023-01-19 DIAGNOSIS — M533 Sacrococcygeal disorders, not elsewhere classified: Secondary | ICD-10-CM | POA: Diagnosis not present

## 2023-01-31 DIAGNOSIS — U071 COVID-19: Secondary | ICD-10-CM | POA: Diagnosis not present

## 2023-01-31 DIAGNOSIS — Z03818 Encounter for observation for suspected exposure to other biological agents ruled out: Secondary | ICD-10-CM | POA: Diagnosis not present

## 2023-01-31 DIAGNOSIS — Z6820 Body mass index (BMI) 20.0-20.9, adult: Secondary | ICD-10-CM | POA: Diagnosis not present

## 2023-01-31 DIAGNOSIS — J069 Acute upper respiratory infection, unspecified: Secondary | ICD-10-CM | POA: Diagnosis not present

## 2023-03-04 ENCOUNTER — Telehealth: Payer: Self-pay | Admitting: Cardiology

## 2023-03-04 MED ORDER — METOPROLOL TARTRATE 25 MG PO TABS
ORAL_TABLET | ORAL | 3 refills | Status: AC
Start: 2023-03-04 — End: ?

## 2023-03-04 NOTE — Telephone Encounter (Signed)
Returned call to patient, reviewed the following information with the patient, she is agreeable to trying new medication, rx to pharm. Patient will call in a couple weeks to provide an update!     "She had very minimal abnormal beats on her prior monitor (<2%), but if she is having more symptoms we could try a low dose of medication. I would have her take 12.5 mg metoprolol tartrate every 12 hours as needed. She needs to watch her heart rate and blood pressure if she needs to take this frequently to make sure that both her HR and BP don;t drop too low."

## 2023-03-04 NOTE — Telephone Encounter (Signed)
She had very minimal abnormal beats on her prior monitor (<2%), but if she is having more symptoms we could try a low dose of medication. I would have her take 12.5 mg metoprolol tartrate every 12 hours as needed. She needs to watch her heart rate and blood pressure if she needs to take this frequently to make sure that both her HR and BP don;t drop too low.

## 2023-03-04 NOTE — Addendum Note (Signed)
Addended by: Marlene Lard on: 03/04/2023 01:19 PM   Modules accepted: Orders

## 2023-03-04 NOTE — Telephone Encounter (Signed)
Patient called stating that when she walks from one room to the other it will cause her heart to race and start to feel sob. Patient states she hasn't been sleeping good at night, Patient stated this started to happen about a month ago. Please advise.

## 2023-03-04 NOTE — Telephone Encounter (Signed)
Please advise 

## 2023-03-04 NOTE — Telephone Encounter (Signed)
Returned call to patient,    Patient states for the last month or so her heart palpitations have gotten worse. She has been getting  more shortness of breath with activity. She just notices this is an increase for her. Palpitations are keeping her awake at night time. Having issues sleeping.   Patient is flying to [place] to be with her sister for surgery. She is very nervous due to family heart issues.   Does check her BP at home in the 1teens/70s, pulse pretty consistently in the 70s.   She does not that she had covid about a month ago.   She mentions that her new PCP, is wanting to wean her off her Lorazepam and even reduced her from a whole tablet to a half tablet. She states this is doing nothing for her anxiety and endorses her symptoms have worsened since they made this change. Encouraged patient to revisit conversation with PCP about stopping medication.  Advised we will speak with Dr. Cristal Deer about getting her something PRN for heart rate control.

## 2023-04-03 ENCOUNTER — Emergency Department (HOSPITAL_BASED_OUTPATIENT_CLINIC_OR_DEPARTMENT_OTHER): Payer: Medicare Other

## 2023-04-03 ENCOUNTER — Emergency Department (HOSPITAL_BASED_OUTPATIENT_CLINIC_OR_DEPARTMENT_OTHER)
Admission: EM | Admit: 2023-04-03 | Discharge: 2023-04-03 | Disposition: A | Discharge status: Home / Self Care | Payer: Medicare Other | Attending: Emergency Medicine | Admitting: Emergency Medicine

## 2023-04-03 ENCOUNTER — Other Ambulatory Visit: Payer: Self-pay

## 2023-04-03 DIAGNOSIS — R519 Headache, unspecified: Secondary | ICD-10-CM | POA: Diagnosis not present

## 2023-04-03 DIAGNOSIS — R000 Tachycardia, unspecified: Secondary | ICD-10-CM | POA: Diagnosis not present

## 2023-04-03 DIAGNOSIS — Z20822 Contact with and (suspected) exposure to covid-19: Secondary | ICD-10-CM | POA: Diagnosis not present

## 2023-04-03 DIAGNOSIS — R112 Nausea with vomiting, unspecified: Secondary | ICD-10-CM | POA: Insufficient documentation

## 2023-04-03 DIAGNOSIS — I10 Essential (primary) hypertension: Secondary | ICD-10-CM | POA: Insufficient documentation

## 2023-04-03 DIAGNOSIS — Z7982 Long term (current) use of aspirin: Secondary | ICD-10-CM | POA: Insufficient documentation

## 2023-04-03 DIAGNOSIS — I672 Cerebral atherosclerosis: Secondary | ICD-10-CM | POA: Diagnosis not present

## 2023-04-03 DIAGNOSIS — Z79899 Other long term (current) drug therapy: Secondary | ICD-10-CM | POA: Insufficient documentation

## 2023-04-03 LAB — TROPONIN I (HIGH SENSITIVITY): Troponin I (High Sensitivity): 4 ng/L (ref <18)

## 2023-04-03 LAB — CBC
HCT: 38.6 % (ref 36.0–46.0)
Hemoglobin: 13.3 g/dL (ref 12.0–15.0)
MCH: 31.3 pg (ref 26.0–34.0)
MCHC: 34.5 g/dL (ref 30.0–36.0)
MCV: 90.8 fL (ref 80.0–100.0)
Platelets: 431 10*3/uL — ABNORMAL HIGH (ref 150–400)
RBC: 4.25 MIL/uL (ref 3.87–5.11)
RDW: 14.1 % (ref 11.5–15.5)
WBC: 13.9 10*3/uL — ABNORMAL HIGH (ref 4.0–10.5)
nRBC: 0 % (ref 0.0–0.2)

## 2023-04-03 LAB — COMPREHENSIVE METABOLIC PANEL
ALT: 10 U/L (ref 0–44)
AST: 17 U/L (ref 15–41)
Albumin: 4.5 g/dL (ref 3.5–5.0)
Alkaline Phosphatase: 64 U/L (ref 38–126)
Anion gap: 15 (ref 5–15)
BUN: 19 mg/dL (ref 8–23)
CO2: 22 mmol/L (ref 22–32)
Calcium: 9.2 mg/dL (ref 8.9–10.3)
Chloride: 102 mmol/L (ref 98–111)
Creatinine, Ser: 0.55 mg/dL (ref 0.44–1.00)
GFR, Estimated: >60 mL/min (ref >60)
Glucose, Bld: 112 mg/dL — ABNORMAL HIGH (ref 70–99)
Potassium: 4 mmol/L (ref 3.5–5.1)
Sodium: 139 mmol/L (ref 135–145)
Total Bilirubin: 0.7 mg/dL (ref 0.3–1.2)
Total Protein: 7.8 g/dL (ref 6.5–8.1)

## 2023-04-03 LAB — RESP PANEL BY RT-PCR (RSV, FLU A&B, COVID)  RVPGX2
Influenza A by PCR: NEGATIVE
Influenza B by PCR: NEGATIVE
Resp Syncytial Virus by PCR: NEGATIVE
SARS Coronavirus 2 by RT PCR: NEGATIVE

## 2023-04-03 LAB — LIPASE, BLOOD: Lipase: 52 U/L — ABNORMAL HIGH (ref 11–51)

## 2023-04-03 MED ORDER — SODIUM CHLORIDE 0.9 % IV BOLUS
1000.0000 mL | Freq: Once | INTRAVENOUS | Status: AC
  Administered 2023-04-03: 1000 mL via INTRAVENOUS

## 2023-04-03 MED ORDER — ACETAMINOPHEN 325 MG PO TABS
650.0000 mg | ORAL_TABLET | Freq: Once | ORAL | Status: AC
  Administered 2023-04-03: 650 mg via ORAL
  Filled 2023-04-03: qty 2

## 2023-04-03 MED ORDER — ONDANSETRON HCL 4 MG PO TABS: 4.0000 mg | ORAL_TABLET | Freq: Four times a day (QID) | ORAL | 0 refills | Status: DC

## 2023-04-03 MED ORDER — ONDANSETRON HCL 4 MG/2ML IJ SOLN
4.0000 mg | Freq: Once | INTRAMUSCULAR | Status: AC
  Administered 2023-04-03: 4 mg via INTRAVENOUS
  Filled 2023-04-03: qty 2

## 2023-04-03 MED ORDER — LORAZEPAM 2 MG/ML IJ SOLN
1.0000 mg | Freq: Once | INTRAMUSCULAR | Status: AC
  Administered 2023-04-03: 1 mg via INTRAVENOUS
  Filled 2023-04-03: qty 1

## 2023-04-03 NOTE — ED Triage Notes (Addendum)
Pt POV from home reporting vomiting that began this morning. Denies abd pain, reports neck and back pain, chronic but worse past few days, has been using CBD cream all over body for pain management. Visibly anxious in triage, states she was staying at her friends house and was unable to take nightly dose of xanax.

## 2023-04-03 NOTE — ED Provider Notes (Addendum)
[place] EMERGENCY DEPARTMENT AT [facility] Provider Note   CSN: [id number] Arrival date & time: 04/03/23  1658     History  Chief Complaint  Patient presents with   Emesis    Paige Calhoun is a 72 y.o. female.  Patient here with nausea and vomiting and full body aches.  Symptoms started this morning.  History of hypertension.  She takes Ativan nightly.  Not take her Ativan last night because she was staying at a friend's house.  She has been using some CBD cream for some joint aches here recently and used a couple doses here recently.  This is new for her.  She has had a couple episodes of emesis and some diarrhea this morning.  She denies any abdominal pain.  She is having some headache and some chronic back pain.  She feels anxious but this feels worse than her normal anxiety.  She denies any stress that she is with think would cause her to feel this way.  No chest pain or shortness of breath weakness numbness or tingling.  The history is provided by the patient.       Home Medications Prior to Admission medications   Medication Sig Start Date End Date Taking? Authorizing Provider  ondansetron (ZOFRAN) 4 MG tablet Take 1 tablet (4 mg total) by mouth every 6 (six) hours. 04/03/23  Yes Neeraj Housand, DO  alendronate (FOSAMAX) 70 MG tablet Take 70 mg by mouth once a week. 07/15/21   [provider]  ascorbic acid (VITAMIN C) 500 MG tablet     [provider]  aspirin EC 81 MG tablet Take 1 tablet (81 mg total) by mouth daily. Swallow whole. 10/19/21   Jodelle Red, MD  Calcium Carb-Cholecalciferol 600-12.5 MG-MCG CAPS Take by oral route.    [provider]  LORazepam (ATIVAN) 1 MG tablet Take 0.5 tablets (0.5 mg total) by mouth as needed for anxiety. 05/01/15   Mady Gemma, PA-C  metoprolol tartrate (LOPRESSOR) 25 MG tablet Take 1/2 tablet every 12 hours as needed for increased palpitations 03/04/23   Jodelle Red, MD   Multiple Vitamins-Minerals (MULTIVITAMIN & MINERAL PO) Take 1 tablet by mouth daily.    [provider]  olmesartan-hydrochlorothiazide (BENICAR HCT) 20-12.5 MG tablet Take 1 tablet by mouth daily.    [provider]  rosuvastatin (CRESTOR) 10 MG tablet Take 1 tablet (10 mg total) by mouth daily. 12/21/21 12/16/22  Jodelle Red, MD  vitamin E 1000 UNIT capsule Take 1,000 Units by mouth daily.    [provider]      Allergies    Doxycycline and Sertraline    Review of Systems   Review of Systems  Physical Exam Updated Vital Signs BP 122/65   Pulse 67   Temp 98.7 F (37.1 C) (Oral)   Resp 20   SpO2 98%  Physical Exam Vitals and nursing note reviewed.  Constitutional:      General: She is in acute distress.     Appearance: She is well-developed. She is not ill-appearing.  HENT:     Head: Normocephalic and atraumatic.     Mouth/Throat:     Mouth: Mucous membranes are moist.  Eyes:     Extraocular Movements: Extraocular movements intact.     Conjunctiva/sclera: Conjunctivae normal.     Pupils: Pupils are equal, round, and reactive to light.  Cardiovascular:     Rate and Rhythm: Normal rate and regular rhythm.     Pulses: Normal pulses.  Heart sounds: No murmur heard. Pulmonary:     Effort: Pulmonary effort is normal. No respiratory distress.     Breath sounds: Normal breath sounds.  Abdominal:     Palpations: Abdomen is soft.     Tenderness: There is no abdominal tenderness.  Musculoskeletal:        General: No swelling.     Cervical back: Normal range of motion and neck supple.  Skin:    General: Skin is warm and dry.     Capillary Refill: Capillary refill takes less than 2 seconds.  Neurological:     General: No focal deficit present.     Mental Status: She is alert and oriented to person, place, and time.     Cranial Nerves: No cranial nerve deficit.     Sensory: No sensory deficit.     Motor: No weakness.     Coordination:  Coordination normal.     Comments: Patient anxious, tremulous, but has 5+ out of 5 strength, normal sensation, no drift, normal visual fields, normal pupils, normal speech, she is tearful  Psychiatric:     Comments: Anxious, having tremors throughout     ED Results / Procedures / Treatments   Labs (all labs ordered are listed, but only abnormal results are displayed) Labs Reviewed  LIPASE, BLOOD - Abnormal; Notable for the following components:      Result Value   Lipase 52 (*)    All other components within normal limits  COMPREHENSIVE METABOLIC PANEL - Abnormal; Notable for the following components:   Glucose, Bld 112 (*)    All other components within normal limits  CBC - Abnormal; Notable for the following components:   WBC 13.9 (*)    Platelets 431 (*)    All other components within normal limits  RESP PANEL BY RT-PCR (RSV, FLU A&B, COVID)  [id number]  TROPONIN I (HIGH SENSITIVITY)    EKG EKG Interpretation Date/Time:  Sunday April 03 2023 17:08:30 EDT Ventricular Rate:  132 PR Interval:  151 QRS Duration:  131 QT Interval:  293 QTC Calculation: 435 R Axis:   85  Text Interpretation: Sinus tachycardia Confirmed by Virgina Norfolk [phone]) on 04/03/2023 5:14:29 PM  Radiology CT Head Wo Contrast  Result Date: 04/03/2023 CLINICAL DATA:  Headache, increasing frequency or severity. Vomiting this morning. Neck and back pain. EXAM: CT HEAD WITHOUT CONTRAST TECHNIQUE: Contiguous axial images were obtained from the base of the skull through the vertex without intravenous contrast. RADIATION DOSE REDUCTION: This exam was performed according to the departmental dose-optimization program which includes automated exposure control, adjustment of the mA and/or kV according to patient size and/or use of iterative reconstruction technique. COMPARISON:  CT head 04/28/2015 FINDINGS: Brain: No intracranial hemorrhage, mass effect, or evidence of acute infarct. No hydrocephalus. No extra-axial  fluid collection. Age-commensurate cerebral atrophy and ill-defined hypoattenuation within the cerebral white matter consistent with chronic small vessel ischemic disease. Vascular: No hyperdense vessel. Intracranial arterial calcification. Skull: No fracture or focal lesion. Sinuses/Orbits: No acute finding. Chronic partial opacification of the right mastoid air cells. Paranasal sinuses and mastoid air cells are well aerated. Other: None. IMPRESSION: 1. No acute intracranial abnormality. Electronically Signed   By: Minerva Fester M.D.   On: 04/03/2023 18:18    Procedures Procedures    Medications Ordered in ED Medications  sodium chloride 0.9 % bolus 1,000 mL ( Intravenous Stopped 04/03/23 1939)  LORazepam (ATIVAN) injection 1 mg (1 mg Intravenous Given 04/03/23 1721)  ondansetron (ZOFRAN) injection 4  mg (4 mg Intravenous Given 04/03/23 1827)  acetaminophen (TYLENOL) tablet 650 mg (650 mg Oral Given 04/03/23 1835)    ED Course/ Medical Decision Making/ A&P                                 Medical Decision Making Amount and/or Complexity of Data Reviewed Labs: ordered. Radiology: ordered.  Risk OTC drugs. Prescription drug management.   Paige Calhoun is here with nausea and vomiting and shaking.  Unremarkable vitals.  Tachycardic.  Sinus tachycardia on EKG.  She takes Ativan chronically but missedDose last night.  Woke up a little bit nauseous with some diarrhea.  She is having headache now, full body shaking, tearful.  She has been using CBD cream for some joint aches here recently.  Use a bunch only.  Not sure she is having a reaction to that.  Right now differential diagnosis could be stress related/anxiety related process, seems less likely to be ACS.  Could be may be she is dealing with some infectious symptoms.  She is having a bad headache.  Overall could be withdrawal from Ativan.  Could be reaction to CBD.  Will check basic labs including troponin, viral testing, get a head CT and chest  x-ray and reevaluate.  Will give her IV fluids and IV Ativan and Zofran.  Lab work per my review and interpretation is unremarkable.  No significant anemia or electrolyte abnormality or kidney injury or leukocytosis.  Troponin normal.  EKG shows sinus tachycardia.  She had greatly improved heart rate after Ativan and fluids.  She is feeling much better.  CT scan of the head per radiology report with no acute findings.  She not have any urinary symptoms.  She overall feels much better.  Suspect may be a reaction to CBD cream or missing dose of Ativan yesterday.  Will prescribe Zofran.  Understands return precautions.  Discharged in good condition.  She will discontinue CBD cream.  This chart was dictated using voice recognition software.  Despite best efforts to proofread,  errors can occur which can change the documentation meaning.         Final Clinical Impression(s) / ED Diagnoses Final diagnoses:  Nausea and vomiting, unspecified vomiting type    Rx / DC Orders ED Discharge Orders          Ordered    ondansetron (ZOFRAN) 4 MG tablet  Every 6 hours        04/03/23 1939              Virgina Norfolk, DO 04/03/23 1941    Virgina Norfolk, DO 04/03/23 1941

## 2023-04-03 NOTE — ED Notes (Signed)
Pt is aware urine is needed 

## 2023-04-03 NOTE — ED Notes (Signed)
 RN reviewed discharge instructions with pt. Pt verbalized understanding and had no further questions. VSS upon discharge.  

## 2023-04-03 NOTE — Discharge Instructions (Signed)
Recommend that you discontinue CBD cream.  Lab work today was unremarkable.  Continue Zofran as needed for nausea and vomiting.  Follow-up with your primary care doctor.  Return if symptoms worsen.

## 2023-04-07 DIAGNOSIS — F411 Generalized anxiety disorder: Secondary | ICD-10-CM | POA: Diagnosis not present

## 2023-04-07 DIAGNOSIS — G4700 Insomnia, unspecified: Secondary | ICD-10-CM | POA: Diagnosis not present

## 2023-04-07 DIAGNOSIS — R251 Tremor, unspecified: Secondary | ICD-10-CM | POA: Diagnosis not present

## 2023-04-21 NOTE — Progress Notes (Signed)
ok 

## 2023-04-28 DIAGNOSIS — F411 Generalized anxiety disorder: Secondary | ICD-10-CM | POA: Diagnosis not present

## 2023-04-28 DIAGNOSIS — G4700 Insomnia, unspecified: Secondary | ICD-10-CM | POA: Diagnosis not present

## 2023-04-28 DIAGNOSIS — I10 Essential (primary) hypertension: Secondary | ICD-10-CM | POA: Diagnosis not present

## 2023-04-28 DIAGNOSIS — L989 Disorder of the skin and subcutaneous tissue, unspecified: Secondary | ICD-10-CM | POA: Diagnosis not present

## 2023-04-28 DIAGNOSIS — E785 Hyperlipidemia, unspecified: Secondary | ICD-10-CM | POA: Diagnosis not present

## 2023-05-05 DIAGNOSIS — M75121 Complete rotator cuff tear or rupture of right shoulder, not specified as traumatic: Secondary | ICD-10-CM | POA: Diagnosis not present

## 2023-05-05 DIAGNOSIS — M67911 Unspecified disorder of synovium and tendon, right shoulder: Secondary | ICD-10-CM | POA: Diagnosis not present

## 2023-05-05 DIAGNOSIS — M24111 Other articular cartilage disorders, right shoulder: Secondary | ICD-10-CM | POA: Diagnosis not present

## 2023-05-05 DIAGNOSIS — M7521 Bicipital tendinitis, right shoulder: Secondary | ICD-10-CM | POA: Diagnosis not present

## 2023-05-05 DIAGNOSIS — M19011 Primary osteoarthritis, right shoulder: Secondary | ICD-10-CM | POA: Diagnosis not present

## 2023-05-05 DIAGNOSIS — M75111 Incomplete rotator cuff tear or rupture of right shoulder, not specified as traumatic: Secondary | ICD-10-CM | POA: Diagnosis not present

## 2023-05-05 DIAGNOSIS — M67921 Unspecified disorder of synovium and tendon, right upper arm: Secondary | ICD-10-CM | POA: Diagnosis not present

## 2023-05-16 ENCOUNTER — Other Ambulatory Visit: Payer: Self-pay | Admitting: Family Medicine

## 2023-05-16 DIAGNOSIS — Z1231 Encounter for screening mammogram for malignant neoplasm of breast: Secondary | ICD-10-CM

## 2023-05-19 DIAGNOSIS — L821 Other seborrheic keratosis: Secondary | ICD-10-CM | POA: Diagnosis not present

## 2023-05-19 DIAGNOSIS — L989 Disorder of the skin and subcutaneous tissue, unspecified: Secondary | ICD-10-CM | POA: Diagnosis not present

## 2023-06-09 DIAGNOSIS — M25811 Other specified joint disorders, right shoulder: Secondary | ICD-10-CM | POA: Diagnosis not present

## 2023-06-14 ENCOUNTER — Ambulatory Visit
Admission: RE | Admit: 2023-06-14 | Discharge: 2023-06-14 | Disposition: A | Discharge status: Home / Self Care | Payer: Medicare Other | Source: Ambulatory Visit | Attending: Family Medicine | Admitting: Family Medicine

## 2023-06-14 DIAGNOSIS — Z1231 Encounter for screening mammogram for malignant neoplasm of breast: Secondary | ICD-10-CM | POA: Diagnosis not present

## 2023-06-17 DIAGNOSIS — G4700 Insomnia, unspecified: Secondary | ICD-10-CM | POA: Diagnosis not present

## 2023-06-17 DIAGNOSIS — Z1211 Encounter for screening for malignant neoplasm of colon: Secondary | ICD-10-CM | POA: Diagnosis not present

## 2023-06-17 DIAGNOSIS — Z23 Encounter for immunization: Secondary | ICD-10-CM | POA: Diagnosis not present

## 2023-06-17 DIAGNOSIS — E785 Hyperlipidemia, unspecified: Secondary | ICD-10-CM | POA: Diagnosis not present

## 2023-06-17 DIAGNOSIS — Z0000 Encounter for general adult medical examination without abnormal findings: Secondary | ICD-10-CM | POA: Diagnosis not present

## 2023-09-15 DIAGNOSIS — Z1211 Encounter for screening for malignant neoplasm of colon: Secondary | ICD-10-CM | POA: Diagnosis not present

## 2023-09-15 DIAGNOSIS — K5900 Constipation, unspecified: Secondary | ICD-10-CM | POA: Diagnosis not present

## 2023-10-18 DIAGNOSIS — K5730 Diverticulosis of large intestine without perforation or abscess without bleeding: Secondary | ICD-10-CM | POA: Diagnosis not present

## 2023-10-18 DIAGNOSIS — D125 Benign neoplasm of sigmoid colon: Secondary | ICD-10-CM | POA: Diagnosis not present

## 2023-10-18 DIAGNOSIS — Q438 Other specified congenital malformations of intestine: Secondary | ICD-10-CM | POA: Diagnosis not present

## 2023-10-18 DIAGNOSIS — Z1211 Encounter for screening for malignant neoplasm of colon: Secondary | ICD-10-CM | POA: Diagnosis not present

## 2023-10-20 DIAGNOSIS — D125 Benign neoplasm of sigmoid colon: Secondary | ICD-10-CM | POA: Diagnosis not present

## 2023-11-25 ENCOUNTER — Ambulatory Visit (HOSPITAL_BASED_OUTPATIENT_CLINIC_OR_DEPARTMENT_OTHER): Admitting: Family

## 2023-11-30 DIAGNOSIS — F411 Generalized anxiety disorder: Secondary | ICD-10-CM | POA: Diagnosis not present

## 2023-11-30 DIAGNOSIS — E785 Hyperlipidemia, unspecified: Secondary | ICD-10-CM | POA: Diagnosis not present

## 2023-11-30 DIAGNOSIS — I10 Essential (primary) hypertension: Secondary | ICD-10-CM | POA: Diagnosis not present

## 2023-12-08 DIAGNOSIS — I10 Essential (primary) hypertension: Secondary | ICD-10-CM | POA: Diagnosis not present

## 2023-12-08 DIAGNOSIS — D75839 Thrombocytosis, unspecified: Secondary | ICD-10-CM | POA: Diagnosis not present

## 2023-12-08 DIAGNOSIS — E785 Hyperlipidemia, unspecified: Secondary | ICD-10-CM | POA: Diagnosis not present

## 2023-12-08 DIAGNOSIS — G4700 Insomnia, unspecified: Secondary | ICD-10-CM | POA: Diagnosis not present

## 2023-12-08 DIAGNOSIS — G479 Sleep disorder, unspecified: Secondary | ICD-10-CM | POA: Diagnosis not present

## 2023-12-08 DIAGNOSIS — N3281 Overactive bladder: Secondary | ICD-10-CM | POA: Diagnosis not present

## 2023-12-08 DIAGNOSIS — R251 Tremor, unspecified: Secondary | ICD-10-CM | POA: Diagnosis not present

## 2023-12-08 DIAGNOSIS — M810 Age-related osteoporosis without current pathological fracture: Secondary | ICD-10-CM | POA: Diagnosis not present

## 2023-12-31 DIAGNOSIS — F411 Generalized anxiety disorder: Secondary | ICD-10-CM | POA: Diagnosis not present

## 2023-12-31 DIAGNOSIS — E785 Hyperlipidemia, unspecified: Secondary | ICD-10-CM | POA: Diagnosis not present

## 2023-12-31 DIAGNOSIS — I10 Essential (primary) hypertension: Secondary | ICD-10-CM | POA: Diagnosis not present

## 2024-01-10 DIAGNOSIS — F411 Generalized anxiety disorder: Secondary | ICD-10-CM | POA: Diagnosis not present

## 2024-01-10 DIAGNOSIS — G479 Sleep disorder, unspecified: Secondary | ICD-10-CM | POA: Diagnosis not present

## 2024-01-30 DIAGNOSIS — F411 Generalized anxiety disorder: Secondary | ICD-10-CM | POA: Diagnosis not present

## 2024-01-30 DIAGNOSIS — I10 Essential (primary) hypertension: Secondary | ICD-10-CM | POA: Diagnosis not present

## 2024-01-30 DIAGNOSIS — E785 Hyperlipidemia, unspecified: Secondary | ICD-10-CM | POA: Diagnosis not present

## 2024-02-01 DIAGNOSIS — M533 Sacrococcygeal disorders, not elsewhere classified: Secondary | ICD-10-CM | POA: Diagnosis not present

## 2024-02-24 DIAGNOSIS — N390 Urinary tract infection, site not specified: Secondary | ICD-10-CM | POA: Diagnosis not present

## 2024-02-24 DIAGNOSIS — M542 Cervicalgia: Secondary | ICD-10-CM | POA: Diagnosis not present

## 2024-03-09 ENCOUNTER — Encounter: Payer: Self-pay | Admitting: Neurology

## 2024-03-15 NOTE — Progress Notes (Unsigned)
 Assessment/Plan:  Cervical Dystonia  -I talked to the patient about the nature and pathophysiology.  The patient is having trouble with ADL's and with rotation of the head in daily life for driving.  The primary muscles involved are the left sternocleidomastoid, right splenius capitis, and slightly the left levator scapulae.  We talked about treatments.  We talked about the value of botox.  The patient was educated on the botulinum toxin the black blox warning and given a copy of the botox patient medication guide.  The patient understands that this warning states that there have been reported cases of the Botox extending beyond the injection site and creating adverse effects, similar to those of botulism. This included loss of strength, trouble walking, hoarseness, trouble saying words clearly, loss of bladder control, trouble breathing, trouble swallowing, diplopia, blurry vision and ptosis. Most of the distant spread of Botox was happening in patients, primarily children, who received medication for spasticity or for cervical dystonia. The patient expressed understanding and desire to proceed.  I will try to get authorization.  We will start with 200 units of Xeomin and likely just do sternocleidomastoid and splenius capitis and see how she does.  - We will do MRI of the cervical spine to make sure there is not an underlying structural abnormality, with the exception of degenerative changes.  She is having neck pain.  She is quite claustrophobic so Valium  was given for the MRI and she will be placed in an open unit.  She knows that she needs a driver to the MRI.    Subjective:   Paige  Calhoun was seen today in neurologic consultation at the request of Dayna Motto, DO.  The consultation is for the evaluation of head tremor.  She is sent for further evaluation.   She reports today that tremor can involve the L arm as well .  Its episodic.  She has some new pain in the muscles of the L neck x 3 weeks.   The first symptom began 9 years ago and back then she had more whole body tremor but she doesn't have leg tremor any more, just the head and L arm.  She doesn't notice if head shakes more if looks in one direction.  She notes head shakes yes direction.    No hx of neck injuries.  Her maternal aunt had Parkinsons Disease.  Her sister has tremor, also in the head.  She had a CT brain in September, 2024 that was unremarkable.  No prior MRI brain is noted.  No imaging of the cervical spine.   ALLERGIES:   Allergies  Allergen Reactions   Doxycycline     CURRENT MEDICATIONS:  Outpatient Encounter Medications as of 03/19/2024  Medication Sig   alendronate (FOSAMAX) 70 MG tablet Take 70 mg by mouth once a week.   ascorbic acid (VITAMIN C) 500 MG tablet    aspirin  EC 81 MG tablet Take 1 tablet (81 mg total) by mouth daily. Swallow whole.   Calcium  Carb-Cholecalciferol 600-12.5 MG-MCG CAPS Take by oral route.   metoprolol  tartrate (LOPRESSOR ) 25 MG tablet Take 1/2 tablet every 12 hours as needed for increased palpitations   Multiple Vitamins-Minerals (MULTIVITAMIN & MINERAL PO) Take 1 tablet by mouth daily.   olmesartan-hydrochlorothiazide (BENICAR HCT) 20-12.5 MG tablet Take 1 tablet by mouth daily.   ondansetron  (ZOFRAN ) 4 MG tablet Take 1 tablet (4 mg total) by mouth every 6 (six) hours.   sertraline (ZOLOFT) 50 MG tablet Take 50 mg  by mouth daily.   traZODone (DESYREL) 50 MG tablet Take 50 mg by mouth at bedtime.   vitamin E 1000 UNIT capsule Take 1,000 Units by mouth daily.   LORazepam  (ATIVAN ) 1 MG tablet Take 0.5 tablets (0.5 mg total) by mouth as needed for anxiety. (Patient not taking: Reported on 03/19/2024)   rosuvastatin  (CRESTOR ) 10 MG tablet Take 1 tablet (10 mg total) by mouth daily.   No facility-administered encounter medications on file as of 03/19/2024.    Objective:   PHYSICAL EXAMINATION:    VITALS:   Vitals:   03/19/24 1005  BP: 132/84  Pulse: 72  SpO2: 98%   Weight: 103 lb (46.7 kg)  Height: 4' 10 (1.473 m)    GEN:  Normal appears female in no acute distress.  Appears stated age. HEENT:  Normocephalic, atraumatic. The mucous membranes are moist. The superficial temporal arteries are without ropiness or tenderness. Cardiovascular: Regular rate and rhythm. Lungs: Clear to auscultation bilaterally. Neck/Heme: There are no carotid bruits noted bilaterally.  Head is slightly turned to the right.  There is hypertrophy of the L SCM.  Head is slightly SB to the left.  She has geste antagoniste (puts hand on the chin)  NEUROLOGICAL: Orientation:  The patient is alert and oriented x 3.   Cranial nerves: There is good facial symmetry.  Extraocular muscles are intact and visual fields are full to confrontational testing. Speech is fluent and clear. Soft palate rises symmetrically and there is no tongue deviation. Hearing is intact to conversational tone. Tone: Tone is good throughout. Sensation: Sensation is intact to light touch and pinprick throughout (facial, trunk, extremities). Vibration is intact at the bilateral big toe. There is no extinction with double simultaneous stimulation. There is no sensory dermatomal level identified. Coordination:  The patient has no difficulty with RAM's or FNF bilaterally. Motor: Strength is 5/5 in the bilateral upper and lower extremities.  Shoulder shrug is equal and symmetric. There is no pronator drift.  There are no fasciculations noted. DTR's: Deep tendon reflexes are 3-/4 at the bilateral biceps, triceps, brachioradialis, patella and achilles.  Plantar responses are downgoing bilaterally. Gait and Station: The patient is able to ambulate without difficulty. She is wide based and slightly tenuous.      Total time spent on today's visit was 45 minutes, including both face-to-face time and nonface-to-face time.  Time included that spent on review of records (prior notes available to me/labs/imaging if pertinent),  discussing treatment and goals, answering patient's questions and coordinating care.   Cc:  Dayna Motto, DO

## 2024-03-19 ENCOUNTER — Ambulatory Visit (INDEPENDENT_AMBULATORY_CARE_PROVIDER_SITE_OTHER): Admitting: Neurology

## 2024-03-19 ENCOUNTER — Other Ambulatory Visit: Payer: Self-pay

## 2024-03-19 ENCOUNTER — Telehealth: Payer: Self-pay

## 2024-03-19 ENCOUNTER — Encounter: Payer: Self-pay | Admitting: Neurology

## 2024-03-19 VITALS — BP 132/84 | HR 72 | Ht <= 58 in | Wt 103.0 lb

## 2024-03-19 DIAGNOSIS — M542 Cervicalgia: Secondary | ICD-10-CM | POA: Diagnosis not present

## 2024-03-19 DIAGNOSIS — G243 Spasmodic torticollis: Secondary | ICD-10-CM

## 2024-03-19 DIAGNOSIS — R292 Abnormal reflex: Secondary | ICD-10-CM | POA: Diagnosis not present

## 2024-03-19 MED ORDER — DIAZEPAM 5 MG PO TABS: ORAL_TABLET | ORAL | 0 refills | Status: DC

## 2024-03-19 NOTE — Telephone Encounter (Signed)
 I have a new start up for this patient [id number] Cervical Dystonia Xeomin 200 units . I need a Pa please

## 2024-03-22 DIAGNOSIS — F411 Generalized anxiety disorder: Secondary | ICD-10-CM | POA: Diagnosis not present

## 2024-03-22 DIAGNOSIS — Z01818 Encounter for other preprocedural examination: Secondary | ICD-10-CM | POA: Diagnosis not present

## 2024-03-26 ENCOUNTER — Other Ambulatory Visit (HOSPITAL_COMMUNITY): Payer: Self-pay

## 2024-03-26 NOTE — Telephone Encounter (Signed)
 Pharmacy Patient Advocate Encounter  Received notification from [facility] that Prior Authorization for Xeomin  100 unit- total 200 units has been APPROVED from 03/26/24 to 03/26/25. Ran test claim, Copay is $195.00. This test claim was processed through [facility]- copay amounts may vary at other pharmacies due to pharmacy/plan contracts, or as the patient moves through the different stages of their insurance plan.   PA #/Case ID/Reference #: [id number]   Submitted Benefits check in Xeomin  portal to explore medical benefits

## 2024-03-26 NOTE — Telephone Encounter (Signed)
 Hi Rachael is there a certain pharmacy to send the RX too or can I send to [facility] ?

## 2024-03-27 ENCOUNTER — Other Ambulatory Visit: Payer: Self-pay

## 2024-03-27 DIAGNOSIS — G243 Spasmodic torticollis: Secondary | ICD-10-CM

## 2024-03-27 MED ORDER — XEOMIN 100 UNITS IM SOLR
100.0000 [IU] | Freq: Once | INTRAMUSCULAR | 2 refills | Status: AC
  Filled 2024-03-29: qty 2, 2d supply, fill #0
  Filled 2024-04-26: qty 2, 90d supply, fill #0
  Filled 2024-08-14: qty 2, 90d supply, fill #1

## 2024-03-27 NOTE — Telephone Encounter (Signed)
 RX sent to [facility] waiting for approval

## 2024-03-29 ENCOUNTER — Other Ambulatory Visit: Payer: Self-pay

## 2024-03-29 ENCOUNTER — Other Ambulatory Visit (HOSPITAL_COMMUNITY): Payer: Self-pay

## 2024-03-29 ENCOUNTER — Other Ambulatory Visit: Payer: Self-pay | Admitting: Pharmacy Technician

## 2024-03-29 NOTE — Progress Notes (Signed)
 PA not needed. LMOM to call back [phone] to set up on boarding and collect copay. No appt has been made at the office yet.

## 2024-03-31 DIAGNOSIS — G243 Spasmodic torticollis: Secondary | ICD-10-CM | POA: Diagnosis not present

## 2024-03-31 DIAGNOSIS — M4802 Spinal stenosis, cervical region: Secondary | ICD-10-CM | POA: Diagnosis not present

## 2024-03-31 DIAGNOSIS — M47812 Spondylosis without myelopathy or radiculopathy, cervical region: Secondary | ICD-10-CM | POA: Diagnosis not present

## 2024-04-01 DIAGNOSIS — I10 Essential (primary) hypertension: Secondary | ICD-10-CM | POA: Diagnosis not present

## 2024-04-01 DIAGNOSIS — E785 Hyperlipidemia, unspecified: Secondary | ICD-10-CM | POA: Diagnosis not present

## 2024-04-01 DIAGNOSIS — F411 Generalized anxiety disorder: Secondary | ICD-10-CM | POA: Diagnosis not present

## 2024-04-05 DIAGNOSIS — H43393 Other vitreous opacities, bilateral: Secondary | ICD-10-CM | POA: Diagnosis not present

## 2024-04-11 NOTE — Progress Notes (Signed)
 Pt has not responded to calls. Will need to speak to patient to do on boarding and collect payment.

## 2024-04-16 ENCOUNTER — Telehealth: Payer: Self-pay | Admitting: Neurology

## 2024-04-16 NOTE — Telephone Encounter (Signed)
 Pt called in this morning and she would like a copy of her MRI that she did on August 30,25. Thanks

## 2024-04-16 NOTE — Telephone Encounter (Signed)
 Patient called needing MRI results [facility]

## 2024-04-19 NOTE — Telephone Encounter (Signed)
 Talked to patient about MRI and onboarding for injections patient is calling [facility] and has already reached out to [facility] to get MRI results

## 2024-04-19 NOTE — Telephone Encounter (Signed)
 Pt called in again today stating that she would like to get a copy of her MRI results, The MRI was done on 03-31-24/ Thanks.

## 2024-04-20 ENCOUNTER — Other Ambulatory Visit: Payer: Self-pay

## 2024-04-24 ENCOUNTER — Other Ambulatory Visit (HOSPITAL_COMMUNITY): Payer: Self-pay

## 2024-04-25 ENCOUNTER — Other Ambulatory Visit (HOSPITAL_COMMUNITY): Payer: Self-pay

## 2024-04-26 ENCOUNTER — Other Ambulatory Visit: Payer: Self-pay

## 2024-04-26 ENCOUNTER — Other Ambulatory Visit (HOSPITAL_COMMUNITY): Payer: Self-pay

## 2024-04-26 NOTE — Progress Notes (Signed)
 Specialty Pharmacy Initial Fill Coordination Note  Paige Calhoun is a 73 y.o. female contacted today regarding initial fill of specialty medication(s) IncobotulinumtoxinA  (Xeomin )   Patient requested Courier to Provider Office   Delivery date: 05/01/24   Verified address: [facility]  [address] [place] [address] [place], [place], [address]   Medication will be filled on 9.29.25.   Patient is aware of $195 copayment.

## 2024-04-26 NOTE — Telephone Encounter (Signed)
 [facility], delivery is set for Tuesday 9.30.25.

## 2024-04-30 ENCOUNTER — Other Ambulatory Visit: Payer: Self-pay

## 2024-05-01 DIAGNOSIS — I10 Essential (primary) hypertension: Secondary | ICD-10-CM | POA: Diagnosis not present

## 2024-05-01 DIAGNOSIS — F411 Generalized anxiety disorder: Secondary | ICD-10-CM | POA: Diagnosis not present

## 2024-05-01 DIAGNOSIS — E785 Hyperlipidemia, unspecified: Secondary | ICD-10-CM | POA: Diagnosis not present

## 2024-05-15 ENCOUNTER — Other Ambulatory Visit (HOSPITAL_COMMUNITY): Payer: Self-pay

## 2024-05-15 NOTE — Telephone Encounter (Signed)
 Ok, thank you

## 2024-05-15 NOTE — Telephone Encounter (Signed)
 Yes we have received this I am so sorry I will have front desk call to schedule her right away . Thank you for letting me know

## 2024-05-25 ENCOUNTER — Ambulatory Visit (INDEPENDENT_AMBULATORY_CARE_PROVIDER_SITE_OTHER): Admitting: Neurology

## 2024-05-25 ENCOUNTER — Other Ambulatory Visit (INDEPENDENT_AMBULATORY_CARE_PROVIDER_SITE_OTHER)

## 2024-05-25 DIAGNOSIS — G243 Spasmodic torticollis: Secondary | ICD-10-CM

## 2024-05-25 MED ORDER — INCOBOTULINUMTOXINA 100 UNITS IM SOLR
200.0000 [IU] | INTRAMUSCULAR | Status: AC
  Administered 2024-05-25: 200 [IU] via INTRAMUSCULAR

## 2024-05-25 NOTE — Procedures (Signed)
 [facility]   Procedure Note Botox  Attending: Dr. Asberry Mauricio Dahlen  Preoperative Diagnosis(es): Cervical Dystonia  Toxin type: Incobotulinumtoxin A  Result History  N/a - this is first injection  Consent obtained from: The patient Benefits discussed included, but were not limited to decreased muscle tightness, increased joint range of motion, and decreased pain.  Risk discussed included, but were not limited pain and discomfort, bleeding, bruising, excessive weakness, venous thrombosis, muscle atrophy and dysphagia.  A copy of the patient medication guide was given to the patient which explains the blackbox warning.  Patients identity and treatment sites confirmed Yes.  .  Details of Procedure: Skin was cleaned with alcohol.  A 30 gauge, 25mm  needle was introduced to the target muscle, except for posterior splenius where 27 gauge, 1.5 inch needle used.   Prior to injection, the needle plunger was aspirated to make sure the needle was not within a blood vessel.  There was no blood retrieved on aspiration.    Following is a summary of the muscles injected  And the amount of Incobotulinumtoxin A used:   Dilution 0.9% preservative free saline mixed with 100 u Botox type A to make 10 U per 0.1cc  Injections  Location Left  Right Units Number of sites        Sternocleidomastoid 50  50 1  Splenius Capitus, posterior approach  70 70 1  Splenius Capitus, lateral approach  30 30 1   Levator Scapulae      Trapezius 20/10  30 2   Semispinalis capitis 20  20 1   TOTAL UNITS:   200    Agent: Incobotulinumtoxin A.  2 Vials of Botox were used, each containing 100 units and freshly diluted with 1 mL of sterile, non-preserved saline   Total injected (Units): 200  Total wasted (Units): none wasted   Pt tolerated procedure well without complications.   Reinjection is anticipated in 3 months.

## 2024-06-01 DIAGNOSIS — I10 Essential (primary) hypertension: Secondary | ICD-10-CM | POA: Diagnosis not present

## 2024-06-01 DIAGNOSIS — E785 Hyperlipidemia, unspecified: Secondary | ICD-10-CM | POA: Diagnosis not present

## 2024-06-01 DIAGNOSIS — F411 Generalized anxiety disorder: Secondary | ICD-10-CM | POA: Diagnosis not present

## 2024-06-21 DIAGNOSIS — Z23 Encounter for immunization: Secondary | ICD-10-CM | POA: Diagnosis not present

## 2024-06-21 DIAGNOSIS — G4700 Insomnia, unspecified: Secondary | ICD-10-CM | POA: Diagnosis not present

## 2024-06-21 DIAGNOSIS — E785 Hyperlipidemia, unspecified: Secondary | ICD-10-CM | POA: Diagnosis not present

## 2024-06-21 DIAGNOSIS — Z0000 Encounter for general adult medical examination without abnormal findings: Secondary | ICD-10-CM | POA: Diagnosis not present

## 2024-06-21 DIAGNOSIS — I10 Essential (primary) hypertension: Secondary | ICD-10-CM | POA: Diagnosis not present

## 2024-06-21 DIAGNOSIS — M810 Age-related osteoporosis without current pathological fracture: Secondary | ICD-10-CM | POA: Diagnosis not present

## 2024-06-21 DIAGNOSIS — D75839 Thrombocytosis, unspecified: Secondary | ICD-10-CM | POA: Diagnosis not present

## 2024-06-24 ENCOUNTER — Other Ambulatory Visit (HOSPITAL_BASED_OUTPATIENT_CLINIC_OR_DEPARTMENT_OTHER): Payer: Self-pay | Admitting: Family Medicine

## 2024-06-24 DIAGNOSIS — M810 Age-related osteoporosis without current pathological fracture: Secondary | ICD-10-CM

## 2024-06-25 ENCOUNTER — Ambulatory Visit: Admitting: Neurology

## 2024-07-01 DIAGNOSIS — F411 Generalized anxiety disorder: Secondary | ICD-10-CM | POA: Diagnosis not present

## 2024-07-01 DIAGNOSIS — I10 Essential (primary) hypertension: Secondary | ICD-10-CM | POA: Diagnosis not present

## 2024-07-01 DIAGNOSIS — E785 Hyperlipidemia, unspecified: Secondary | ICD-10-CM | POA: Diagnosis not present

## 2024-07-12 NOTE — Progress Notes (Unsigned)
 Assessment/Plan:  Cervical Dystonia -Patient had first series of Botox injections May 25, 2024.     Subjective:   Paige Calhoun was seen today in follow-up for cervical dystonia.  She had her first series of Botox injections on October 24.  She reports that ***.  She did have an MRI cervical spine demonstrating multi level neuroforaminal stenosis, but no central canal stenosis.   ALLERGIES:   Allergies  Allergen Reactions   Doxycycline     CURRENT MEDICATIONS:  Outpatient Encounter Medications as of 07/13/2024  Medication Sig   alendronate (FOSAMAX) 70 MG tablet Take 70 mg by mouth once a week.   ascorbic acid (VITAMIN C) 500 MG tablet    aspirin  EC 81 MG tablet Take 1 tablet (81 mg total) by mouth daily. Swallow whole.   Calcium  Carb-Cholecalciferol 600-12.5 MG-MCG CAPS Take by oral route.   diazepam  (VALIUM ) 5 MG tablet 1 tablet 1 hour prior to MRI; can repeat 30 min prior to MRI if needed   metoprolol  tartrate (LOPRESSOR ) 25 MG tablet Take 1/2 tablet every 12 hours as needed for increased palpitations   Multiple Vitamins-Minerals (MULTIVITAMIN & MINERAL PO) Take 1 tablet by mouth daily.   olmesartan-hydrochlorothiazide (BENICAR HCT) 20-12.5 MG tablet Take 1 tablet by mouth daily.   ondansetron  (ZOFRAN ) 4 MG tablet Take 1 tablet (4 mg total) by mouth every 6 (six) hours.   rosuvastatin  (CRESTOR ) 10 MG tablet Take 1 tablet (10 mg total) by mouth daily.   sertraline (ZOLOFT) 50 MG tablet Take 50 mg by mouth daily.   traZODone (DESYREL) 50 MG tablet Take 50 mg by mouth at bedtime.   vitamin E 1000 UNIT capsule Take 1,000 Units by mouth daily.   Facility-Administered Encounter Medications as of 07/13/2024  Medication   incobotulinumtoxinA  (XEOMIN ) 100 units injection 200 Units    Objective:   PHYSICAL EXAMINATION:    VITALS:   There were no vitals filed for this visit.   GEN:  Normal appears female in no acute distress.  Appears stated age. HEENT:   Normocephalic, atraumatic. The mucous membranes are moist. The superficial temporal arteries are without ropiness or tenderness. Cardiovascular: Regular rate and rhythm. Lungs: Clear to auscultation bilaterally. Neck/Heme: There are no carotid bruits noted bilaterally.  Head is slightly turned to the right.  There is hypertrophy of the L SCM.  Head is slightly SB to the left.  She has geste antagoniste (puts hand on the chin)  NEUROLOGICAL: Orientation:  The patient is alert and oriented x 3.   Cranial nerves: There is good facial symmetry.  Extraocular muscles are intact and visual fields are full to confrontational testing. Speech is fluent and clear. Soft palate rises symmetrically and there is no tongue deviation. Hearing is intact to conversational tone. Tone: Tone is good throughout. Sensation: Sensation is intact to light touch and pinprick throughout (facial, trunk, extremities). Vibration is intact at the bilateral big toe. There is no extinction with double simultaneous stimulation. There is no sensory dermatomal level identified. Coordination:  The patient has no difficulty with RAM's or FNF bilaterally. Motor: Strength is 5/5 in the bilateral upper and lower extremities.  Shoulder shrug is equal and symmetric. There is no pronator drift.  There are no fasciculations noted. DTR's: Deep tendon reflexes are 3-/4 at the bilateral biceps, triceps, brachioradialis, patella and achilles.  Plantar responses are downgoing bilaterally. Gait and Station: The patient is able to ambulate without difficulty. She is wide based and slightly tenuous.  Total time spent on today's visit was *** minutes, including both face-to-face time and nonface-to-face time.  Time included that spent on review of records (prior notes available to me/labs/imaging if pertinent), discussing treatment and goals, answering patient's questions and coordinating care.   Cc:  Dayna Motto, DO

## 2024-07-13 ENCOUNTER — Encounter: Payer: Self-pay | Admitting: Neurology

## 2024-07-13 ENCOUNTER — Ambulatory Visit (INDEPENDENT_AMBULATORY_CARE_PROVIDER_SITE_OTHER): Admitting: Neurology

## 2024-07-13 VITALS — BP 136/74 | HR 67 | Wt 109.6 lb

## 2024-07-13 DIAGNOSIS — G243 Spasmodic torticollis: Secondary | ICD-10-CM | POA: Diagnosis not present

## 2024-07-13 NOTE — Patient Instructions (Signed)
°  VISIT SUMMARY: Today, we discussed your progress following your first Botox treatment for cervical dystonia. You have experienced significant improvement on the right side of your neck and a reduction in head tremors, although you still have some soreness on the left side of your neck.  YOUR PLAN: -CERVICAL DYSTONIA: Cervical dystonia is a condition where the muscles in your neck contract uncontrollably, causing your head to twist or turn to one side. Your Botox treatment has improved the symptoms on the right side of your neck and reduced your head tremors. To address the soreness on the left side, we will increase the Botox dose for that area. You should continue receiving Botox treatments every three months.  INSTRUCTIONS: Please schedule a follow-up appointment for January 23rd to monitor your progress and adjust treatment as necessary.                      Contains text generated by Abridge.                                 Contains text generated by Abridge.

## 2024-07-16 ENCOUNTER — Telehealth: Payer: Self-pay | Admitting: Neurology

## 2024-07-16 NOTE — Telephone Encounter (Signed)
 Pt called in this morning, and Pt wants to know if her Botox injection  dose increase. Will the  co payment increase. Thanks

## 2024-07-17 ENCOUNTER — Telehealth: Payer: Self-pay

## 2024-07-17 ENCOUNTER — Other Ambulatory Visit: Payer: Self-pay

## 2024-07-17 DIAGNOSIS — G243 Spasmodic torticollis: Secondary | ICD-10-CM

## 2024-07-17 MED ORDER — XEOMIN 100 UNITS IM SOLR: INTRAMUSCULAR | 0 refills | Status: AC

## 2024-07-17 NOTE — Telephone Encounter (Signed)
 Sorry it is for Xeomin  300 units [phone]

## 2024-07-17 NOTE — Telephone Encounter (Signed)
 Patient needs an increased dose for Botox to 300 units does she need a new PA for this ? [phone]

## 2024-07-18 ENCOUNTER — Telehealth: Payer: Self-pay | Admitting: Cardiology

## 2024-07-18 ENCOUNTER — Encounter (HOSPITAL_BASED_OUTPATIENT_CLINIC_OR_DEPARTMENT_OTHER): Payer: Self-pay | Admitting: Cardiology

## 2024-07-18 ENCOUNTER — Ambulatory Visit (INDEPENDENT_AMBULATORY_CARE_PROVIDER_SITE_OTHER): Admitting: Cardiology

## 2024-07-18 VITALS — BP 138/78 | HR 78 | Ht <= 58 in | Wt 109.3 lb

## 2024-07-18 DIAGNOSIS — I2510 Atherosclerotic heart disease of native coronary artery without angina pectoris: Secondary | ICD-10-CM | POA: Diagnosis not present

## 2024-07-18 DIAGNOSIS — R011 Cardiac murmur, unspecified: Secondary | ICD-10-CM | POA: Diagnosis not present

## 2024-07-18 DIAGNOSIS — R002 Palpitations: Secondary | ICD-10-CM | POA: Diagnosis not present

## 2024-07-18 DIAGNOSIS — I10 Essential (primary) hypertension: Secondary | ICD-10-CM

## 2024-07-18 DIAGNOSIS — Z8249 Family history of ischemic heart disease and other diseases of the circulatory system: Secondary | ICD-10-CM

## 2024-07-18 DIAGNOSIS — E7800 Pure hypercholesterolemia, unspecified: Secondary | ICD-10-CM | POA: Diagnosis not present

## 2024-07-18 NOTE — Patient Instructions (Signed)
 Medication Instructions:  Please call and let Dr. Lonni know what dose of rosuvastatin  is on your bottle at home that you are currently taking.  *If you need a refill on your cardiac medications before your next appointment, please call your pharmacy*  Lab Work: none If you have labs (blood work) drawn today and your tests are completely normal, you will receive your results only by: MyChart Message (if you have MyChart) OR A paper copy in the mail If you have any lab test that is abnormal or we need to change your treatment, we will call you to review the results.  Testing/Procedures: none  Follow-Up: At [facility], you and your health needs are our priority.  As part of our continuing mission to provide you with exceptional heart care, our providers are all part of one team.  This team includes your primary Cardiologist (physician) and Advanced Practice Providers or APPs (Physician Assistants and Nurse Practitioners) who all work together to provide you with the care you need, when you need it.  Your next appointment:   12 month(s)  Provider:   Shelda Lonni, MD, Rosaline Bane, NP, or Reche Finder, NP

## 2024-07-18 NOTE — Telephone Encounter (Signed)
 Called patient back and spoke with her that we will have to wait for new AP and for pharmacy to call her with price. I unfortunately do not know the new price at this time

## 2024-07-18 NOTE — Telephone Encounter (Signed)
 Pt c/o medication issue:  1. Name of Medication: ROSUVASTATIN  CALCIUM  PO   2. How are you currently taking this medication (dosage and times per day)? N/A  3. Are you having a reaction (difficulty breathing--STAT)? N/A  4. What is your medication issue? Pt was told to report this medication dosage which is 40 MG

## 2024-07-18 NOTE — Telephone Encounter (Signed)
 Will forward to Dr Cristal Deer for review

## 2024-07-18 NOTE — Progress Notes (Signed)
 Cardiology Office Note:  .   Date:  07/18/2024  ID:  Paige Calhoun, DOB 12/02/1950, MRN [id number] PCP: Dayna Motto, DO  [facility] Providers Cardiologist:  Shelda Bruckner, MD {  History of Present Illness: .   Paige Calhoun is a 73 y.o. female  with a hx of anxiety, hypertension who is seen for follow up today. I initially saw her 07/12/2018 as a new consult at the request of Rankins, Richerd SAUNDERS, MD for the evaluation and management of palpitations, fatigue, and shortness of breath.   Pertinent CV history: Ca score 09/2021 was 166 (77th %ile). Echo 07/2018 with EF 55-60%. Monitor 08/2018 without high risk arrhythmia.  Today: Has not had any palpitations at night recently. Completely resolved on their own. Only change around that time was starting trazodone for sleep at night. Still struggles with sleep but palpitations totally resolved. Shortness of breath also improved. Stays active, watches a 74 year old boy, no limitations.   Reviewed her lipids in [facility]. Cholesterol significantly worse than in 2023. She is on a lower dose on rosuvastatin  due to muscle aches, which improved with decreasing the dose. Believes this is 5 mg dose. However, on review of pharmacy dispensing, looks like she was on 40 mg rosuvastatin  at one time, then 20 mg dose. Unclear what she is on now.   ROS: Denies chest pain, shortness of breath at rest or with normal exertion. No PND, orthopnea, LE edema or unexpected weight gain. No syncope or palpitations. ROS otherwise negative except as noted.   Studies Reviewed: SABRA    EKG:  EKG Interpretation Date/Time:  Wednesday July 18 2024 10:05:18 EST Ventricular Rate:  71 PR Interval:  180 QRS Duration:  80 QT Interval:  376 QTC Calculation: 408 R Axis:   53  Text Interpretation: Normal sinus rhythm Normal ECG Confirmed by Bruckner Shelda [phone]) on 07/18/2024 10:14:38 AM    Physical Exam:   VS:  BP 138/78   Pulse 78   Ht 4' 10 (1.473 m)    Wt 109 lb 4.8 oz (49.6 kg)   SpO2 98%   BMI 22.84 kg/m    Wt Readings from Last 3 Encounters:  07/18/24 109 lb 4.8 oz (49.6 kg)  07/13/24 109 lb 9.6 oz (49.7 kg)  03/19/24 103 lb (46.7 kg)    GEN: Well nourished, well developed in no acute distress HEENT: Normal, moist mucous membranes NECK: No JVD CARDIAC: regular rhythm, normal S1 and S2, no rubs or gallops. 2/6 systolic murmur. VASCULAR: Radial and DP pulses 2+ bilaterally. No carotid bruits RESPIRATORY:  Clear to auscultation without rales, wheezing or rhonchi  ABDOMEN: Soft, non-tender, non-distended MUSCULOSKELETAL:  Ambulates independently SKIN: Warm and dry, no edema NEUROLOGIC:  Alert and oriented x 3. No focal neuro deficits noted. PSYCHIATRIC:  Normal affect    ASSESSMENT AND PLAN: .    Palpitations: resolved -monitor with rare (<2%) PVCs -echo without structural abnormalities   Hypertension:  -continue olmesartan-HCTZ   Hypercholesterolemia Nonobstructive CAD based on calcium  score Family history of heart disease -last lipids 06/21/24: Tchol 226, LDL 140, TG 128, HDL 64. Prior in 2023 was Tchol 143, HDL 67, LDL 63, TG 63 -LDL goal <70 -continue aspirin  -on rosuvastatin , unknown dose. From what I can see, she was on 40 mg dose at one time, then decreased to 20 mg. She was initially on 10 mg dose in 2023 with good control, so I do not know why her LDL is now significantly higher as it appears  she was on 20 mg at the time. Will ask patient to call with her dose on her bottle when she gets home, otherwise we can call her pharmacy -discussed ezetimibe as possible option if we cannot increase statin dose due to myalgia  Murmur -soft, sound like aortic sclerosis, echo with aortic valve calcification without stenosis  CV risk counseling and prevention -recommend heart healthy/Mediterranean diet, with whole grains, fruits, vegetable, fish, lean meats, nuts, and olive oil. Limit salt. -recommend moderate walking, 3-5  times/week for 30-50 minutes each session. Aim for at least 150 minutes/week. Goal should be pace of 3 miles/hours, or walking 1.5 miles in 30 minutes -recommend avoidance of tobacco products. Avoid excess alcohol.  Dispo: 1 year or sooner as needed  Signed, Shelda Bruckner, MD   Shelda Bruckner, MD, PhD, [facility] [place]  [facility]  [facility] [address] [place], [place] [address] [phone]

## 2024-07-20 ENCOUNTER — Other Ambulatory Visit (HOSPITAL_COMMUNITY): Payer: Self-pay

## 2024-07-24 NOTE — Telephone Encounter (Signed)
 Called back to patient and verified she is taking HALF TABLET of rosuvastatin  40, so 20 mg daily. Medication list has been updated.

## 2024-07-30 ENCOUNTER — Other Ambulatory Visit (HOSPITAL_COMMUNITY): Payer: Self-pay

## 2024-07-30 NOTE — Telephone Encounter (Signed)
 New PA is not for 300u. 100 x3.

## 2024-07-30 NOTE — Telephone Encounter (Signed)
 Sorry meant to say new PA is NOT NEEDED. Current PA will cover 100 x3.

## 2024-08-14 ENCOUNTER — Other Ambulatory Visit (HOSPITAL_COMMUNITY): Payer: Self-pay

## 2024-08-24 ENCOUNTER — Ambulatory Visit: Admitting: Neurology

## 2024-10-12 ENCOUNTER — Ambulatory Visit: Admitting: Neurology
